# Patient Record
Sex: Male | Born: 1980 | Race: Black or African American | Hispanic: No | Marital: Single | Smoking: Current every day smoker
Health system: Southern US, Community
[De-identification: ages and names within clinical notes are randomized; demographics above are authoritative.]

## PROBLEM LIST (undated history)

## (undated) DIAGNOSIS — I82401 Acute embolism and thrombosis of unspecified deep veins of right lower extremity: Secondary | ICD-10-CM

## (undated) DIAGNOSIS — I2699 Other pulmonary embolism without acute cor pulmonale: Secondary | ICD-10-CM

## (undated) DIAGNOSIS — I10 Essential (primary) hypertension: Secondary | ICD-10-CM

## (undated) DIAGNOSIS — E119 Type 2 diabetes mellitus without complications: Secondary | ICD-10-CM

## (undated) DIAGNOSIS — E785 Hyperlipidemia, unspecified: Secondary | ICD-10-CM

## (undated) DIAGNOSIS — E079 Disorder of thyroid, unspecified: Secondary | ICD-10-CM

## (undated) HISTORY — DX: Type 2 diabetes mellitus without complications: E11.9

## (undated) HISTORY — DX: Acute embolism and thrombosis of unspecified deep veins of right lower extremity: I82.401

## (undated) HISTORY — PX: WISDOM TOOTH EXTRACTION: SHX21

## (undated) HISTORY — DX: Hyperlipidemia, unspecified: E78.5

---

## 2017-07-22 ENCOUNTER — Other Ambulatory Visit: Payer: Self-pay

## 2017-07-22 ENCOUNTER — Emergency Department (HOSPITAL_COMMUNITY)
Admission: EM | Admit: 2017-07-22 | Discharge: 2017-07-22 | Disposition: A | Discharge status: Home / Self Care | Payer: Self-pay | Attending: Emergency Medicine | Admitting: Emergency Medicine

## 2017-07-22 ENCOUNTER — Encounter (HOSPITAL_COMMUNITY): Payer: Self-pay | Admitting: Emergency Medicine

## 2017-07-22 DIAGNOSIS — Z79899 Other long term (current) drug therapy: Secondary | ICD-10-CM | POA: Insufficient documentation

## 2017-07-22 DIAGNOSIS — K0889 Other specified disorders of teeth and supporting structures: Secondary | ICD-10-CM | POA: Insufficient documentation

## 2017-07-22 DIAGNOSIS — I10 Essential (primary) hypertension: Secondary | ICD-10-CM | POA: Insufficient documentation

## 2017-07-22 HISTORY — DX: Essential (primary) hypertension: I10

## 2017-07-22 MED ORDER — IBUPROFEN 800 MG PO TABS
800.0000 mg | ORAL_TABLET | Freq: Once | ORAL | Status: AC
  Administered 2017-07-22: 800 mg via ORAL
  Filled 2017-07-22: qty 1

## 2017-07-22 MED ORDER — PENICILLIN V POTASSIUM 500 MG PO TABS: 500.0000 mg | ORAL_TABLET | Freq: Three times a day (TID) | ORAL | 0 refills | Status: DC

## 2017-07-22 MED ORDER — OXYCODONE-ACETAMINOPHEN 5-325 MG PO TABS
1.0000 | ORAL_TABLET | Freq: Once | ORAL | Status: AC
  Administered 2017-07-22: 1 via ORAL
  Filled 2017-07-22: qty 1

## 2017-07-22 MED ORDER — PENICILLIN V POTASSIUM 250 MG PO TABS
500.0000 mg | ORAL_TABLET | Freq: Once | ORAL | Status: AC
  Administered 2017-07-22: 500 mg via ORAL
  Filled 2017-07-22: qty 2

## 2017-07-22 MED ORDER — IBUPROFEN 600 MG PO TABS: 600.0000 mg | ORAL_TABLET | Freq: Four times a day (QID) | ORAL | 0 refills | Status: DC | PRN

## 2017-07-22 NOTE — ED Triage Notes (Signed)
Pt c/o right lower dental pain 10/10 since yesterday no getting relief with any OTC medication, no fever or chills.

## 2017-07-22 NOTE — ED Provider Notes (Signed)
[facility] Provider Note   CSN: [id number] Arrival date & time: 07/22/17  0133     History   Chief Complaint Chief Complaint  Patient presents with  . Dental Pain    HPI Trish FountainStacey Basque is a 36 y.o. male.  Patient to ED with complaint of severe dental pain in right lower molar. He denies facial swelling, difficulty swallowing, fever. No broken teeth or longstanding issues. Pain since yesterday. He has been taking ibuprofen without relief.    The history is provided by the patient. No language interpreter was used.    Past Medical History:  Diagnosis Date  . Hypertension     There are no active problems to display for this patient.   History reviewed. No pertinent surgical history.     Home Medications    Prior to Admission medications   Medication Sig Start Date End Date Taking? Authorizing Provider  acetaminophen (TYLENOL) 650 MG CR tablet Take 1,300 mg by mouth every 8 (eight) hours as needed for pain.   Yes [provider]  hydrochlorothiazide (HYDRODIURIL) 25 MG tablet Take 25 mg by mouth daily.   Yes [provider]  PRESCRIPTION MEDICATION Take 1 tablet by mouth daily. Little green tablet for blood pressure   Yes [provider]    Family History History reviewed. No pertinent family history.  Social History Social History   Tobacco Use  . Smoking status: Never Smoker  . Smokeless tobacco: Never Used  Substance Use Topics  . Alcohol use: No    Frequency: Never  . Drug use: No     Allergies   Patient has no known allergies.   Review of Systems Review of Systems  Constitutional: Negative for fever.  HENT: Positive for dental problem. Negative for facial swelling and trouble swallowing.   Gastrointestinal: Negative for nausea.     Physical Exam Updated Vital Signs BP (!) 168/88 (BP Location: Right Arm)   Pulse 65   Temp 98.3 F (36.8 C) (Oral)   Resp 14   Ht 6\' 2"   (1.88 m)   Wt 136.1 kg (300 lb)   SpO2 100%   BMI 38.52 kg/m   Physical Exam  Constitutional: He is oriented to person, place, and time. He appears well-developed and well-nourished.  HENT:  Partially edentulous with multiple missing molars. #29 does not appear to have caries. No visualized abscess. No facial swelling or submental lymphadenopathy.   Neck: Normal range of motion.  Pulmonary/Chest: Effort normal.  Musculoskeletal: Normal range of motion.  Neurological: He is alert and oriented to person, place, and time.  Skin: Skin is warm and dry.  Psychiatric: He has a normal mood and affect.     ED Treatments / Results  Labs (all labs ordered are listed, but only abnormal results are displayed) Labs Reviewed - No data to display  EKG  EKG Interpretation None       Radiology No results found.  Procedures Procedures (including critical care time)  Medications Ordered in ED Medications  oxyCODONE-acetaminophen (PERCOCET/ROXICET) 5-325 MG per tablet 1 tablet (not administered)  penicillin v potassium (VEETID) tablet 500 mg (not administered)  ibuprofen (ADVIL,MOTRIN) tablet 800 mg (not administered)     Initial Impression / Assessment and Plan / ED Course  I have reviewed the triage vital signs and the nursing notes.  Pertinent labs & imaging results that were available during my care of the patient were reviewed by me and considered in my medical decision  making (see chart for details).     Patient with dental pain. No visualized abscess or dental caries. Will cover with antibiotics and provide dental resources for outpatient follow up.   Final Clinical Impressions(s) / ED Diagnoses   Final diagnoses:  None   1. Dental pain  ED Discharge Orders    None       Elpidio AnisUpstill, Jonni Oelkers, PA-C 07/25/17 2023    Shon BatonHorton, Courtney F, MD 07/30/17 [id number]

## 2017-08-16 ENCOUNTER — Emergency Department (HOSPITAL_COMMUNITY)
Admission: EM | Admit: 2017-08-16 | Discharge: 2017-08-16 | Disposition: A | Discharge status: Home / Self Care | Payer: Self-pay | Attending: Emergency Medicine | Admitting: Emergency Medicine

## 2017-08-16 ENCOUNTER — Encounter (HOSPITAL_COMMUNITY): Payer: Self-pay | Admitting: Emergency Medicine

## 2017-08-16 DIAGNOSIS — Z79899 Other long term (current) drug therapy: Secondary | ICD-10-CM | POA: Insufficient documentation

## 2017-08-16 DIAGNOSIS — R55 Syncope and collapse: Secondary | ICD-10-CM | POA: Insufficient documentation

## 2017-08-16 DIAGNOSIS — I10 Essential (primary) hypertension: Secondary | ICD-10-CM | POA: Insufficient documentation

## 2017-08-16 LAB — CBC
HCT: 41.8 % (ref 39.0–52.0)
Hemoglobin: 14.1 g/dL (ref 13.0–17.0)
MCH: 30 pg (ref 26.0–34.0)
MCHC: 33.7 g/dL (ref 30.0–36.0)
MCV: 88.9 fL (ref 78.0–100.0)
Platelets: 207 10*3/uL (ref 150–400)
RBC: 4.7 MIL/uL (ref 4.22–5.81)
RDW: 12.8 % (ref 11.5–15.5)
WBC: 4.7 10*3/uL (ref 4.0–10.5)

## 2017-08-16 LAB — BASIC METABOLIC PANEL
Anion gap: 7 (ref 5–15)
BUN: 9 mg/dL (ref 6–20)
CHLORIDE: 101 mmol/L (ref 101–111)
CO2: 30 mmol/L (ref 22–32)
CREATININE: 1.04 mg/dL (ref 0.61–1.24)
Calcium: 9 mg/dL (ref 8.9–10.3)
GFR calc non Af Amer: >60 mL/min (ref >60)
GLUCOSE: 93 mg/dL (ref 65–99)
Potassium: 3.8 mmol/L (ref 3.5–5.1)
Sodium: 138 mmol/L (ref 135–145)

## 2017-08-16 LAB — CBG MONITORING, ED: Glucose-Capillary: 71 mg/dL (ref 65–99)

## 2017-08-16 MED ORDER — LISINOPRIL 20 MG PO TABS: 20.0000 mg | ORAL_TABLET | Freq: Every day | ORAL | 2 refills | Status: DC

## 2017-08-16 NOTE — ED Provider Notes (Signed)
[facility]-EMERGENCY DEPT Provider Note   CSN: [id number] Arrival date & time: 08/16/17  1232     History   Chief Complaint Chief Complaint  Patient presents with  . Dizziness  . Near Syncope    HPI Trish FountainStacey Chipley is a 37 y.o. male.  Patient states that he had some dizziness this morning.  It only lasted for less than an hour.  He has been taking blood pressure medicine but has not taken it in 2 months.   The history is provided by the patient.  Dizziness  Quality:  Lightheadedness and imbalance Severity:  Moderate Onset quality:  Sudden Timing:  Rare Progression:  Resolved Chronicity:  New Context: not with bowel movement   Associated symptoms: no chest pain, no diarrhea and no headaches   Near Syncope  Pertinent negatives include no chest pain, no abdominal pain and no headaches.    Past Medical History:  Diagnosis Date  . Hypertension     There are no active problems to display for this patient.   History reviewed. No pertinent surgical history.     Home Medications    Prior to Admission medications   Medication Sig Start Date End Date Taking? Authorizing Provider  acetaminophen (TYLENOL) 650 MG CR tablet Take 1,300 mg by mouth every 8 (eight) hours as needed for pain.    [provider]  hydrochlorothiazide (HYDRODIURIL) 25 MG tablet Take 25 mg by mouth daily.    [provider]  ibuprofen (ADVIL,MOTRIN) 600 MG tablet Take 1 tablet (600 mg total) by mouth every 6 (six) hours as needed. 07/22/17   Elpidio AnisUpstill, Shari, PA-C  lisinopril (PRINIVIL,ZESTRIL) 20 MG tablet Take 1 tablet (20 mg total) by mouth daily. 08/16/17   Bethann BerkshireZammit, Lluvia Gwynne, MD  penicillin v potassium (VEETID) 500 MG tablet Take 1 tablet (500 mg total) by mouth 3 (three) times daily. 07/22/17   Elpidio AnisUpstill, Shari, PA-C  PRESCRIPTION MEDICATION Take 1 tablet by mouth daily. Little green tablet for blood pressure    [provider]    Family History No family  history on file.  Social History Social History   Tobacco Use  . Smoking status: Never Smoker  . Smokeless tobacco: Never Used  Substance Use Topics  . Alcohol use: No    Frequency: Never  . Drug use: No     Allergies   Patient has no known allergies.   Review of Systems Review of Systems  Constitutional: Negative for appetite change and fatigue.  HENT: Negative for congestion, ear discharge and sinus pressure.   Eyes: Negative for discharge.  Respiratory: Negative for cough.   Cardiovascular: Positive for near-syncope. Negative for chest pain.  Gastrointestinal: Negative for abdominal pain and diarrhea.  Genitourinary: Negative for frequency and hematuria.  Musculoskeletal: Negative for back pain.  Skin: Negative for rash.  Neurological: Positive for dizziness. Negative for seizures and headaches.  Psychiatric/Behavioral: Negative for hallucinations.     Physical Exam Updated Vital Signs BP (!) 132/92 (BP Location: Right Arm)   Pulse (!) 56   Temp 98.2 F (36.8 C) (Oral)   Resp 14   Ht 6\' 2"  (1.88 m)   Wt 136.1 kg (300 lb)   SpO2 100%   BMI 38.52 kg/m   Physical Exam  Constitutional: He is oriented to person, place, and time. He appears well-developed.  HENT:  Head: Normocephalic.  Eyes: Conjunctivae and EOM are normal. No scleral icterus.  Neck: Neck supple. No thyromegaly present.  Cardiovascular: Normal rate and  regular rhythm. Exam reveals no gallop and no friction rub.  No murmur heard. Pulmonary/Chest: No stridor. He has no wheezes. He has no rales. He exhibits no tenderness.  Abdominal: He exhibits no distension. There is no tenderness. There is no rebound.  Musculoskeletal: Normal range of motion. He exhibits no edema.  Lymphadenopathy:    He has no cervical adenopathy.  Neurological: He is oriented to person, place, and time. He exhibits normal muscle tone. Coordination normal.  Skin: No rash noted. No erythema.  Psychiatric: He has a normal  mood and affect. His behavior is normal.     ED Treatments / Results  Labs (all labs ordered are listed, but only abnormal results are displayed) Labs Reviewed  BASIC METABOLIC PANEL  CBC  CBG MONITORING, ED    EKG  EKG Interpretation  Date/Time:  Thursday August 16 2017 12:52:12 EST Ventricular Rate:  70 PR Interval:    QRS Duration: 89 QT Interval:  385 QTC Calculation: 416 R Axis:   3 Text Interpretation:  Sinus rhythm Low voltage, precordial leads Anteroseptal infarct, old Nonspecific T abnormalities, lateral leads ST elevation, consider inferior injury Baseline wander in lead(s) I III aVL Confirmed by Bethann Berkshire [phone]) on 08/16/2017 6:44:29 PM       Radiology No results found.  Procedures Procedures (including critical care time)  Medications Ordered in ED Medications - No data to display   Initial Impression / Assessment and Plan / ED Course  I have reviewed the triage vital signs and the nursing notes.  Pertinent labs & imaging results that were available during my care of the patient were reviewed by me and considered in my medical decision making (see chart for details).     Patient with a near syncopal episode.  Possibly related to him not taking his blood pressure medicine.  Labs and EKG unremarkable.  Patient will be placed back on his lisinopril will follow-up with family doctor  Final Clinical Impressions(s) / ED Diagnoses   Final diagnoses:  Near syncope    ED Discharge Orders        Ordered    lisinopril (PRINIVIL,ZESTRIL) 20 MG tablet  Daily     08/16/17 1929       Bethann Berkshire, MD 08/16/17 1938

## 2017-08-16 NOTE — Discharge Instructions (Signed)
Start taking your blood pressure medicine and follow-up with her primary care doctor in the next couple weeks return if any problems

## 2017-08-16 NOTE — ED Triage Notes (Signed)
Per [facility] patient from home for dizziness that started about hour ago when woke up. Patient had near syncopal episode.  Patient doesn't feel right. Patient out of HTN for couple days.

## 2017-08-16 NOTE — ED Notes (Signed)
ED Provider at bedside. 

## 2017-11-17 ENCOUNTER — Encounter (HOSPITAL_COMMUNITY): Payer: Self-pay | Admitting: Emergency Medicine

## 2017-11-17 ENCOUNTER — Emergency Department (HOSPITAL_COMMUNITY)
Admission: EM | Admit: 2017-11-17 | Discharge: 2017-11-17 | Disposition: A | Discharge status: Home / Self Care | Payer: Self-pay | Attending: Emergency Medicine | Admitting: Emergency Medicine

## 2017-11-17 ENCOUNTER — Other Ambulatory Visit: Payer: Self-pay

## 2017-11-17 DIAGNOSIS — I10 Essential (primary) hypertension: Secondary | ICD-10-CM | POA: Insufficient documentation

## 2017-11-17 DIAGNOSIS — Z79899 Other long term (current) drug therapy: Secondary | ICD-10-CM | POA: Insufficient documentation

## 2017-11-17 DIAGNOSIS — Z760 Encounter for issue of repeat prescription: Secondary | ICD-10-CM | POA: Insufficient documentation

## 2017-11-17 DIAGNOSIS — F17210 Nicotine dependence, cigarettes, uncomplicated: Secondary | ICD-10-CM | POA: Insufficient documentation

## 2017-11-17 MED ORDER — LISINOPRIL 20 MG PO TABS: 20.0000 mg | ORAL_TABLET | Freq: Every day | ORAL | 3 refills | Status: DC

## 2017-11-17 NOTE — ED Notes (Signed)
Bed: WTR6 Expected date:  Expected time:  Means of arrival:  Comments: 

## 2017-11-17 NOTE — ED Provider Notes (Signed)
[facility]-EMERGENCY DEPT Provider Note   CSN: [id number] Arrival date & time: 11/17/17  0941     History   Chief Complaint Chief Complaint  Patient presents with  . Medication Refill    HPI Glenn Conrad is a 37 y.o. male.  HPI Patient is a 37 year old male with a history of hypertension.  He is requesting a refill of his 20 mg a day lisinopril.  He denies any symptoms at this time.  His initial primary care doctor visit is scheduled for May 2019.  He is requesting a refill until then.   Past Medical History:  Diagnosis Date  . Hypertension     There are no active problems to display for this patient.   Past Surgical History:  Procedure Laterality Date  . WISDOM TOOTH EXTRACTION          Home Medications    Prior to Admission medications   Medication Sig Start Date End Date Taking? Authorizing Provider  acetaminophen (TYLENOL) 650 MG CR tablet Take 1,300 mg by mouth every 8 (eight) hours as needed for pain.    [provider]  hydrochlorothiazide (HYDRODIURIL) 25 MG tablet Take 25 mg by mouth daily.    [provider]  ibuprofen (ADVIL,MOTRIN) 600 MG tablet Take 1 tablet (600 mg total) by mouth every 6 (six) hours as needed. 07/22/17   Elpidio Anis, PA-C  lisinopril (PRINIVIL,ZESTRIL) 20 MG tablet Take 1 tablet (20 mg total) by mouth daily. 11/17/17   Azalia Bilis, MD  penicillin v potassium (VEETID) 500 MG tablet Take 1 tablet (500 mg total) by mouth 3 (three) times daily. 07/22/17   Elpidio Anis, PA-C  PRESCRIPTION MEDICATION Take 1 tablet by mouth daily. Little green tablet for blood pressure    [provider]    Family History Family History  Problem Relation Age of Onset  . Diabetes Mother   . Hypertension Mother     Social History Social History   Tobacco Use  . Smoking status: Current Every Day Smoker    Packs/day: 0.50  . Smokeless tobacco: Never Used  Substance Use Topics  . Alcohol use: No      Frequency: Never  . Drug use: No     Allergies   Patient has no known allergies.   Review of Systems Review of Systems  All other systems reviewed and are negative.    Physical Exam Updated Vital Signs BP 116/64 (BP Location: Left Arm)   Pulse 66   Temp 98.3 F (36.8 C) (Oral)   Resp 18   Wt 131.5 kg (290 lb)   SpO2 99%   BMI 37.23 kg/m   Physical Exam  Constitutional: He is oriented to person, place, and time. He appears well-developed and well-nourished.  HENT:  Head: Normocephalic.  Eyes: EOM are normal.  Neck: Normal range of motion.  Pulmonary/Chest: Effort normal.  Abdominal: He exhibits no distension.  Musculoskeletal: Normal range of motion.  Neurological: He is alert and oriented to person, place, and time.  Psychiatric: He has a normal mood and affect.  Nursing note and vitals reviewed.    ED Treatments / Results  Labs (all labs ordered are listed, but only abnormal results are displayed) Labs Reviewed - No data to display  EKG None  Radiology No results found.  Procedures Procedures (including critical care time)  Medications Ordered in ED Medications - No data to display   Initial Impression / Assessment and Plan / ED Course  I have  reviewed the triage vital signs and the nursing notes.  Pertinent labs & imaging results that were available during my care of the patient were reviewed by me and considered in my medical decision making (see chart for details).      Final Clinical Impressions(s) / ED Diagnoses   Final diagnoses:  Medication refill    ED Discharge Orders        Ordered    lisinopril (PRINIVIL,ZESTRIL) 20 MG tablet  Daily     11/17/17 1012       Azalia Bilis, MD 11/17/17 1013

## 2017-11-17 NOTE — ED Triage Notes (Signed)
Pt is requesting a refill on his BP medication. Lisinopril was last filled in January

## 2018-02-04 ENCOUNTER — Emergency Department (HOSPITAL_COMMUNITY)
Admission: EM | Admit: 2018-02-04 | Discharge: 2018-02-04 | Disposition: A | Discharge status: Home / Self Care | Payer: Self-pay | Attending: Emergency Medicine | Admitting: Emergency Medicine

## 2018-02-04 ENCOUNTER — Encounter (HOSPITAL_COMMUNITY): Payer: Self-pay

## 2018-02-04 DIAGNOSIS — I10 Essential (primary) hypertension: Secondary | ICD-10-CM | POA: Insufficient documentation

## 2018-02-04 DIAGNOSIS — F17200 Nicotine dependence, unspecified, uncomplicated: Secondary | ICD-10-CM | POA: Insufficient documentation

## 2018-02-04 DIAGNOSIS — Z79899 Other long term (current) drug therapy: Secondary | ICD-10-CM | POA: Insufficient documentation

## 2018-02-04 MED ORDER — HYDROCHLOROTHIAZIDE 25 MG PO TABS: 25.0000 mg | ORAL_TABLET | Freq: Every day | ORAL | 0 refills | Status: DC

## 2018-02-04 NOTE — ED Notes (Signed)
Pt denies dizziness, SOB or HA at this time. Has had intermittent HA previously.

## 2018-02-04 NOTE — Discharge Instructions (Addendum)
You can start taking HCTZ, stop taking Lisinopril.   Schedule an appointment with [facility] (listed below) to establish care with a primary doctor.   Return to the ER for any new or concerning symptoms like chest pain, trouble breathing, trouble with your vision.

## 2018-02-04 NOTE — ED Provider Notes (Signed)
[facility]   CSN: [id number] Arrival date & time: 02/04/18  1559     History   Chief Complaint Chief Complaint  Patient presents with  . Hypertension    HPI Glenn Conrad is a 37 y.o. male.  HPI   Glenn Conrad is a 37yo male with a history of HTN who presents to the emergency department for evaluation of elevated blood pressure.  Patient reports that yesterday he had a headache which she thought was related to high blood pressure.  Today he went to [facility] to have his BP checked and it was 182/117.  States that he has a mild temporal headache today, thinks it is related to not eating for the past several hours.  States that he has been prescribed lisinopril from the ED, but it is not helping him.  He is looking for a PCP, but has not made an appointment.  He states that he was on a regimen of HCTZ and another antihypertensive while in prison and his blood pressure was under control.  He believes that the lisinopril is not working and is asking for a different medication.  Denies visual disturbance, numbness, weakness, chest pain, shortness of breath, abdominal pain, nausea/vomiting.  Past Medical History:  Diagnosis Date  . Hypertension     There are no active problems to display for this patient.   Past Surgical History:  Procedure Laterality Date  . WISDOM TOOTH EXTRACTION          Home Medications    Prior to Admission medications   Medication Sig Start Date End Date Taking? Authorizing Provider  acetaminophen (TYLENOL) 650 MG CR tablet Take 1,300 mg by mouth every 8 (eight) hours as needed for pain.    [provider]  hydrochlorothiazide (HYDRODIURIL) 25 MG tablet Take 25 mg by mouth daily.    [provider]  ibuprofen (ADVIL,MOTRIN) 600 MG tablet Take 1 tablet (600 mg total) by mouth every 6 (six) hours as needed. 07/22/17   Elpidio Anis, PA-C  lisinopril (PRINIVIL,ZESTRIL) 20 MG tablet Take  1 tablet (20 mg total) by mouth daily. 11/17/17   Azalia Bilis, MD  penicillin v potassium (VEETID) 500 MG tablet Take 1 tablet (500 mg total) by mouth 3 (three) times daily. 07/22/17   Elpidio Anis, PA-C  PRESCRIPTION MEDICATION Take 1 tablet by mouth daily. Little green tablet for blood pressure    [provider]    Family History Family History  Problem Relation Age of Onset  . Diabetes Mother   . Hypertension Mother     Social History Social History   Tobacco Use  . Smoking status: Current Every Day Smoker    Packs/day: 0.50  . Smokeless tobacco: Never Used  Substance Use Topics  . Alcohol use: No    Frequency: Never  . Drug use: No     Allergies   Patient has no known allergies.   Review of Systems Review of Systems  Constitutional: Negative for chills and fever.  Eyes: Negative for visual disturbance.  Respiratory: Negative for shortness of breath.   Cardiovascular: Negative for chest pain and leg swelling.  Gastrointestinal: Negative for abdominal pain, nausea and vomiting.  Genitourinary: Negative for difficulty urinating.  Musculoskeletal: Negative for gait problem.  Skin: Negative for rash.  Neurological: Positive for headaches. Negative for syncope, speech difficulty, weakness, light-headedness and numbness.  Psychiatric/Behavioral: Negative for agitation.    Physical Exam Updated Vital Signs BP 136/90   Pulse Marland Kitchen)  52   Temp 98 F (36.7 C) (Oral)   Resp 10   Ht 6\' 2"  (1.88 m)   Wt (!) 142.4 kg (314 lb)   SpO2 98%   BMI 40.32 kg/m   Physical Exam  Constitutional: He is oriented to person, place, and time. He appears well-developed and well-nourished. No distress.  Nontoxic-appearing.  HENT:  Head: Normocephalic and atraumatic.  Mouth/Throat: Oropharynx is clear and moist. No oropharyngeal exudate.  Eyes: Pupils are equal, round, and reactive to light. Conjunctivae are normal. Right eye exhibits no discharge. Left eye exhibits no  discharge.  Neck: Normal range of motion. Neck supple. No JVD present.  Cardiovascular: Normal rate, regular rhythm and intact distal pulses. Exam reveals no friction rub.  No murmur heard. Pulmonary/Chest: Effort normal and breath sounds normal. No stridor. No respiratory distress. He has no wheezes. He has no rales.  Abdominal: Soft. Bowel sounds are normal. There is no tenderness.  Neurological: He is alert and oriented to person, place, and time. Coordination normal.  Skin: Skin is warm and dry. Capillary refill takes less than 2 seconds. He is not diaphoretic.  Psychiatric: He has a normal mood and affect. His behavior is normal.  Nursing note and vitals reviewed.    ED Treatments / Results  Labs (all labs ordered are listed, but only abnormal results are displayed) Labs Reviewed - No data to display  EKG None  Radiology No results found.  Procedures Procedures (including critical care time)  Medications Ordered in ED Medications - No data to display   Initial Impression / Assessment and Plan / ED Course  I have reviewed the triage vital signs and the nursing notes.  Pertinent labs & imaging results that were available during my care of the patient were reviewed by me and considered in my medical decision making (see chart for details).    Patient's blood pressure in the ER is 135/84. He is asking to be prescribed a different antihypertensive, as he believes that the lisinopril is not working for him given his blood pressure was elevated earlier today.  I agreed to start HCTZ instead of lisinopril.  I reviewed his prior lab work and his electrolytes were within normal, creatinine normal.  I have counseled him extensively that he needs to establish care with a primary doctor and have given him the information to establish care with [facility] given he does not have insurance.  Have counseled him on reasons to return to the emergency department he agrees and voiced  understanding to the above plan and appears reliable for follow-up.  Final Clinical Impressions(s) / ED Diagnoses   Final diagnoses:  Hypertension, unspecified type    ED Discharge Orders        Ordered    hydrochlorothiazide (HYDRODIURIL) 25 MG tablet  Daily     02/04/18 2212       Kellie ShropshireShrosbree, Shuntavia Yerby J, PA-C 02/04/18 2317    Lorre NickAllen, Anthony, MD 02/05/18 1750

## 2018-02-04 NOTE — ED Triage Notes (Signed)
Pt presents with c/o hypertension. Pt reports that he did have some headaches yesterday as well as this morning and when he went to [facility] to have his BP checked it was 182/117. Pt does have a hx of high BP, taking Lisinopril on a daily basis, has had his dose this morning.

## 2018-05-27 ENCOUNTER — Other Ambulatory Visit: Payer: Self-pay

## 2018-05-27 ENCOUNTER — Encounter (HOSPITAL_COMMUNITY): Payer: Self-pay

## 2018-05-27 ENCOUNTER — Emergency Department (HOSPITAL_COMMUNITY)
Admission: EM | Admit: 2018-05-27 | Discharge: 2018-05-27 | Disposition: A | Discharge status: Home / Self Care | Payer: Self-pay | Attending: Emergency Medicine | Admitting: Emergency Medicine

## 2018-05-27 DIAGNOSIS — R51 Headache: Secondary | ICD-10-CM | POA: Insufficient documentation

## 2018-05-27 DIAGNOSIS — Z79899 Other long term (current) drug therapy: Secondary | ICD-10-CM | POA: Insufficient documentation

## 2018-05-27 DIAGNOSIS — R519 Headache, unspecified: Secondary | ICD-10-CM

## 2018-05-27 DIAGNOSIS — F17210 Nicotine dependence, cigarettes, uncomplicated: Secondary | ICD-10-CM | POA: Insufficient documentation

## 2018-05-27 DIAGNOSIS — I10 Essential (primary) hypertension: Secondary | ICD-10-CM | POA: Insufficient documentation

## 2018-05-27 DIAGNOSIS — Z760 Encounter for issue of repeat prescription: Secondary | ICD-10-CM | POA: Insufficient documentation

## 2018-05-27 HISTORY — DX: Disorder of thyroid, unspecified: E07.9

## 2018-05-27 MED ORDER — METOCLOPRAMIDE HCL 10 MG PO TABS
10.0000 mg | ORAL_TABLET | Freq: Once | ORAL | Status: AC
  Administered 2018-05-27: 10 mg via ORAL
  Filled 2018-05-27: qty 1

## 2018-05-27 MED ORDER — IBUPROFEN 200 MG PO TABS
600.0000 mg | ORAL_TABLET | Freq: Once | ORAL | Status: AC
  Administered 2018-05-27: 600 mg via ORAL
  Filled 2018-05-27: qty 3

## 2018-05-27 MED ORDER — HYDROCHLOROTHIAZIDE 25 MG PO TABS: 25.0000 mg | ORAL_TABLET | Freq: Every day | ORAL | 2 refills | Status: DC

## 2018-05-27 NOTE — ED Triage Notes (Signed)
Patient reports that he has been out of BP meds approx 2 months. Patient states the last med prescribed was Lisinopril.

## 2018-05-27 NOTE — ED Provider Notes (Signed)
[facility]-EMERGENCY DEPT Provider Note   CSN: [id number] Arrival date & time: 05/27/18  4132     History   Chief Complaint Chief Complaint  Patient presents with  . Medication Refill  . Headache    HPI Glenn Conrad is a 37 y.o. male.  HPI Patient states he has been out of his blood pressure medication for some time.  States he woke up this morning with a left-sided headache.  Denies any known injury.  States he frequently gets headaches associated with elevations in his blood pressure.  No nausea or vomiting but does endorse some photophobia.  Denies focal weakness or numbness.  No chest pain or shortness of breath. Past Medical History:  Diagnosis Date  . Hypertension   . Thyroid disease     There are no active problems to display for this patient.   Past Surgical History:  Procedure Laterality Date  . WISDOM TOOTH EXTRACTION          Home Medications    Prior to Admission medications   Medication Sig Start Date End Date Taking? Authorizing Provider  acetaminophen (TYLENOL) 650 MG CR tablet Take 1,300 mg by mouth every 8 (eight) hours as needed for pain.    [provider]  hydrochlorothiazide (HYDRODIURIL) 25 MG tablet Take 25 mg by mouth daily.    [provider]  hydrochlorothiazide (HYDRODIURIL) 25 MG tablet Take 1 tablet (25 mg total) by mouth daily. 02/04/18   Kellie Shropshire, PA-C  ibuprofen (ADVIL,MOTRIN) 600 MG tablet Take 1 tablet (600 mg total) by mouth every 6 (six) hours as needed. 07/22/17   Elpidio Anis, PA-C  lisinopril (PRINIVIL,ZESTRIL) 20 MG tablet Take 1 tablet (20 mg total) by mouth daily. 11/17/17   Azalia Bilis, MD  penicillin v potassium (VEETID) 500 MG tablet Take 1 tablet (500 mg total) by mouth 3 (three) times daily. 07/22/17   Elpidio Anis, PA-C  PRESCRIPTION MEDICATION Take 1 tablet by mouth daily. Little green tablet for blood pressure    [provider]    Family History Family  History  Problem Relation Age of Onset  . Diabetes Mother   . Hypertension Mother     Social History Social History   Tobacco Use  . Smoking status: Current Every Day Smoker    Packs/day: 0.50  . Smokeless tobacco: Never Used  Substance Use Topics  . Alcohol use: No    Frequency: Never  . Drug use: No     Allergies   Patient has no known allergies.   Review of Systems Review of Systems  Constitutional: Negative for chills and fever.  HENT: Negative for facial swelling, sinus pressure, sore throat and trouble swallowing.   Eyes: Positive for photophobia. Negative for visual disturbance.  Respiratory: Negative for cough and shortness of breath.   Cardiovascular: Negative for chest pain, palpitations and leg swelling.  Gastrointestinal: Negative for abdominal pain, diarrhea, nausea and vomiting.  Musculoskeletal: Negative for back pain, myalgias and neck pain.  Skin: Negative for rash and wound.  Neurological: Positive for headaches. Negative for dizziness, syncope, weakness, light-headedness and numbness.  All other systems reviewed and are negative.    Physical Exam Updated Vital Signs BP (!) 139/93 (BP Location: Left Arm)   Pulse (!) 55   Temp 98.2 F (36.8 C) (Oral)   Resp 18   Ht 6\' 2"  (1.88 m)   Wt (!) 144.9 kg   SpO2 100%   BMI 41.02 kg/m   Physical Exam  Constitutional: He is oriented to person, place, and time. He appears well-developed and well-nourished. No distress.  HENT:  Head: Normocephalic and atraumatic.  Mouth/Throat: Oropharynx is clear and moist. No oropharyngeal exudate.  No temporal artery tenderness to palpation.  No sinus tenderness with percussion.  Eyes: Pupils are equal, round, and reactive to light. EOM are normal.  Neck: Normal range of motion. Neck supple.  Cardiovascular: Normal rate and regular rhythm. Exam reveals no gallop and no friction rub.  No murmur heard. Pulmonary/Chest: Effort normal and breath sounds normal. No  stridor. No respiratory distress. He has no wheezes. He has no rales. He exhibits no tenderness.  Abdominal: Soft. Bowel sounds are normal. There is no tenderness. There is no rebound and no guarding.  Musculoskeletal: Normal range of motion. He exhibits no edema or tenderness.  Neurological: He is alert and oriented to person, place, and time.  Moves all extremities without focal deficit.  Sensation fully intact.  Skin: Skin is warm and dry. Capillary refill takes less than 2 seconds. No rash noted. He is not diaphoretic. No erythema.  Psychiatric: He has a normal mood and affect. His behavior is normal.  Nursing note and vitals reviewed.    ED Treatments / Results  Labs (all labs ordered are listed, but only abnormal results are displayed) Labs Reviewed - No data to display  EKG None  Radiology No results found.  Procedures Procedures (including critical care time)  Medications Ordered in ED Medications  ibuprofen (ADVIL,MOTRIN) tablet 600 mg (has no administration in time range)  metoCLOPramide (REGLAN) tablet 10 mg (has no administration in time range)     Initial Impression / Assessment and Plan / ED Course  I have reviewed the triage vital signs and the nursing notes.  Pertinent labs & imaging results that were available during my care of the patient were reviewed by me and considered in my medical decision making (see chart for details).    Patient's headache improved significantly with Reglan and ibuprofen.  Will refill his blood pressure medication.  Has been encouraged to to establish care with primary physician regarding blood pressure control.  Return precautions given.   Final Clinical Impressions(s) / ED Diagnoses   Final diagnoses:  None    ED Discharge Orders    None       Loren Racer, MD 05/29/18 [phone]

## 2018-05-27 NOTE — ED Notes (Signed)
ED Provider at bedside. 

## 2018-06-16 ENCOUNTER — Emergency Department (HOSPITAL_COMMUNITY)
Admission: EM | Admit: 2018-06-16 | Discharge: 2018-06-16 | Disposition: A | Discharge status: Home / Self Care | Payer: Self-pay | Attending: Emergency Medicine | Admitting: Emergency Medicine

## 2018-06-16 ENCOUNTER — Other Ambulatory Visit: Payer: Self-pay

## 2018-06-16 ENCOUNTER — Encounter (HOSPITAL_COMMUNITY): Payer: Self-pay

## 2018-06-16 DIAGNOSIS — I10 Essential (primary) hypertension: Secondary | ICD-10-CM | POA: Insufficient documentation

## 2018-06-16 DIAGNOSIS — L259 Unspecified contact dermatitis, unspecified cause: Secondary | ICD-10-CM | POA: Insufficient documentation

## 2018-06-16 DIAGNOSIS — L299 Pruritus, unspecified: Secondary | ICD-10-CM | POA: Insufficient documentation

## 2018-06-16 DIAGNOSIS — F17210 Nicotine dependence, cigarettes, uncomplicated: Secondary | ICD-10-CM | POA: Insufficient documentation

## 2018-06-16 DIAGNOSIS — E079 Disorder of thyroid, unspecified: Secondary | ICD-10-CM | POA: Insufficient documentation

## 2018-06-16 MED ORDER — DOXYCYCLINE HYCLATE 100 MG PO CAPS: 100.0000 mg | ORAL_CAPSULE | Freq: Two times a day (BID) | ORAL | 0 refills | Status: DC

## 2018-06-16 MED ORDER — HYDROXYZINE HCL 25 MG PO TABS: 25.0000 mg | ORAL_TABLET | Freq: Four times a day (QID) | ORAL | 0 refills | Status: DC | PRN

## 2018-06-16 MED ORDER — METHYLPREDNISOLONE ACETATE 80 MG/ML IJ SUSP
80.0000 mg | Freq: Once | INTRAMUSCULAR | Status: AC
  Administered 2018-06-16: 80 mg via INTRAMUSCULAR
  Filled 2018-06-16: qty 1

## 2018-06-16 NOTE — ED Provider Notes (Signed)
[facility]-EMERGENCY DEPT Provider Note   CSN: [id number] Arrival date & time: 06/16/18  0420     History   Chief Complaint Chief Complaint  Patient presents with  . Rash    HPI Glenn Conrad is a 37 y.o. male.  Patient presents to the emergency department for evaluation of rash.  Patient reports that 3 days ago he started with a small spot on the back of his elbow that he thought was a bite.  The area that blistered up and since then he has had progression of swelling, itching, burning pain with blisters over the forearm area.  Denies any new skin products or topical contacts.  No tongue swelling, throat swelling, difficulty breathing.     Past Medical History:  Diagnosis Date  . Hypertension   . Thyroid disease     There are no active problems to display for this patient.   Past Surgical History:  Procedure Laterality Date  . WISDOM TOOTH EXTRACTION          Home Medications    Prior to Admission medications   Medication Sig Start Date End Date Taking? Authorizing Provider  aspirin-acetaminophen-caffeine (EXCEDRIN MIGRAINE) [phone] MG tablet Take 2 tablets by mouth every 8 (eight) hours as needed for headache.    [provider]  doxycycline (VIBRAMYCIN) 100 MG capsule Take 1 capsule (100 mg total) by mouth 2 (two) times daily. 06/16/18   Gilda Crease, MD  hydrochlorothiazide (HYDRODIURIL) 25 MG tablet Take 1 tablet (25 mg total) by mouth daily. 05/27/18   Loren Racer, MD  hydrOXYzine (ATARAX/VISTARIL) 25 MG tablet Take 1-2 tablets (25-50 mg total) by mouth every 6 (six) hours as needed. 06/16/18   Gilda Crease, MD  ibuprofen (ADVIL,MOTRIN) 600 MG tablet Take 1 tablet (600 mg total) by mouth every 6 (six) hours as needed. Patient not taking: Reported on 05/27/2018 07/22/17   Elpidio Anis, PA-C  lisinopril (PRINIVIL,ZESTRIL) 20 MG tablet Take 1 tablet (20 mg total) by mouth daily. Patient not taking:  Reported on 05/27/2018 11/17/17   Azalia Bilis, MD  penicillin v potassium (VEETID) 500 MG tablet Take 1 tablet (500 mg total) by mouth 3 (three) times daily. Patient not taking: Reported on 05/27/2018 07/22/17   Elpidio Anis, PA-C    Family History Family History  Problem Relation Age of Onset  . Diabetes Mother   . Hypertension Mother     Social History Social History   Tobacco Use  . Smoking status: Current Every Day Smoker    Packs/day: 0.50  . Smokeless tobacco: Never Used  Substance Use Topics  . Alcohol use: No    Frequency: Never  . Drug use: No     Allergies   Patient has no known allergies.   Review of Systems Review of Systems  Skin: Positive for rash.  All other systems reviewed and are negative.    Physical Exam Updated Vital Signs BP (!) 129/92   Pulse 72   Temp 97.8 F (36.6 C) (Oral)   Resp 17   SpO2 95%   Physical Exam  Constitutional: He is oriented to person, place, and time. He appears well-developed and well-nourished.  HENT:  Head: Normocephalic.  Eyes: Pupils are equal, round, and reactive to light.  Neck: Neck supple.  Cardiovascular: Normal rate and regular rhythm.  Pulmonary/Chest: Effort normal and breath sounds normal.  Musculoskeletal: Normal range of motion.  Neurological: He is alert and oriented to person, place, and time.  Skin:  Diffuse vesicular eruption over right forearm -nearly circumferential.  Serosanguineous drainage and yellow crusting noted over dorsal aspect of forearm     ED Treatments / Results  Labs (all labs ordered are listed, but only abnormal results are displayed) Labs Reviewed - No data to display  EKG None  Radiology No results found.  Procedures Procedures (including critical care time)  Medications Ordered in ED Medications  methylPREDNISolone acetate (DEPO-MEDROL) injection 80 mg (has no administration in time range)     Initial Impression / Assessment and Plan / ED Course  I  have reviewed the triage vital signs and the nursing notes.  Pertinent labs & imaging results that were available during my care of the patient were reviewed by me and considered in my medical decision making (see chart for details).     Patient presents with 3-day history of rash on his right arm.  He thinks he might have suffered a bite that precipitated the rash.  Morphology looks like contact dermatitis, although cannot rule out impetigo or superinfection.  Morphology and distribution not consistent with shingles.  Will treat with Depo-Medrol and doxycycline.  Final Clinical Impressions(s) / ED Diagnoses   Final diagnoses:  Contact dermatitis, unspecified contact dermatitis type, unspecified trigger    ED Discharge Orders         Ordered    doxycycline (VIBRAMYCIN) 100 MG capsule  2 times daily     06/16/18 0511    hydrOXYzine (ATARAX/VISTARIL) 25 MG tablet  Every 6 hours PRN     06/16/18 0511           Gilda Crease, MD 06/16/18 [phone]

## 2018-06-16 NOTE — ED Triage Notes (Signed)
Pt reports raised rash on L forearm to upper arm. He states that he thinks he may have been bitten by something on Wednesday. Pt endorses burning, redness, itching, and heat from area.

## 2018-08-24 ENCOUNTER — Encounter (HOSPITAL_COMMUNITY): Payer: Self-pay | Admitting: Emergency Medicine

## 2018-08-24 ENCOUNTER — Emergency Department (HOSPITAL_COMMUNITY)
Admission: EM | Admit: 2018-08-24 | Discharge: 2018-08-24 | Disposition: A | Discharge status: Home / Self Care | Payer: Self-pay | Attending: Emergency Medicine | Admitting: Emergency Medicine

## 2018-08-24 DIAGNOSIS — F17200 Nicotine dependence, unspecified, uncomplicated: Secondary | ICD-10-CM | POA: Insufficient documentation

## 2018-08-24 DIAGNOSIS — E079 Disorder of thyroid, unspecified: Secondary | ICD-10-CM | POA: Insufficient documentation

## 2018-08-24 DIAGNOSIS — Z7982 Long term (current) use of aspirin: Secondary | ICD-10-CM | POA: Insufficient documentation

## 2018-08-24 DIAGNOSIS — I10 Essential (primary) hypertension: Secondary | ICD-10-CM | POA: Insufficient documentation

## 2018-08-24 DIAGNOSIS — Z760 Encounter for issue of repeat prescription: Secondary | ICD-10-CM | POA: Insufficient documentation

## 2018-08-24 MED ORDER — HYDROCHLOROTHIAZIDE 25 MG PO TABS: 25.0000 mg | ORAL_TABLET | Freq: Every day | ORAL | 1 refills | Status: DC

## 2018-08-24 NOTE — ED Provider Notes (Signed)
[facility]-EMERGENCY DEPT Provider Note   CSN: [id number] Arrival date & time: 08/24/18  6440     History   Chief Complaint Chief Complaint  Patient presents with  . Medication Refill    HPI Glenn Conrad is a 38 y.o. male with history of hypertension and thyroid disease presenting requesting refill of HCTZ.  Has been out for 2 days.  Reports that he does not check his blood pressure at home but will check it when he goes to a pharmacy and it has been generally well controlled while on 25 mg HCTZ.  Denies headache, chest pain, shortness of breath, weakness, or decreased urine output.  He is in the process of establishing care with a PCP.  The history is provided by the patient.    Past Medical History:  Diagnosis Date  . Hypertension   . Thyroid disease     There are no active problems to display for this patient.   Past Surgical History:  Procedure Laterality Date  . WISDOM TOOTH EXTRACTION          Home Medications    Prior to Admission medications   Medication Sig Start Date End Date Taking? Authorizing Provider  aspirin-acetaminophen-caffeine (EXCEDRIN MIGRAINE) [phone] MG tablet Take 2 tablets by mouth every 8 (eight) hours as needed for headache.    [provider]  doxycycline (VIBRAMYCIN) 100 MG capsule Take 1 capsule (100 mg total) by mouth 2 (two) times daily. 06/16/18   Gilda Crease, MD  hydrochlorothiazide (HYDRODIURIL) 25 MG tablet Take 1 tablet (25 mg total) by mouth daily. 08/24/18   Traniya Prichett A, PA-C  hydrOXYzine (ATARAX/VISTARIL) 25 MG tablet Take 1-2 tablets (25-50 mg total) by mouth every 6 (six) hours as needed. 06/16/18   Gilda Crease, MD  ibuprofen (ADVIL,MOTRIN) 600 MG tablet Take 1 tablet (600 mg total) by mouth every 6 (six) hours as needed. Patient not taking: Reported on 05/27/2018 07/22/17   Elpidio Anis, PA-C  lisinopril (PRINIVIL,ZESTRIL) 20 MG tablet Take 1 tablet (20 mg total) by  mouth daily. Patient not taking: Reported on 05/27/2018 11/17/17   Azalia Bilis, MD  penicillin v potassium (VEETID) 500 MG tablet Take 1 tablet (500 mg total) by mouth 3 (three) times daily. Patient not taking: Reported on 05/27/2018 07/22/17   Elpidio Anis, PA-C    Family History Family History  Problem Relation Age of Onset  . Diabetes Mother   . Hypertension Mother     Social History Social History   Tobacco Use  . Smoking status: Current Every Day Smoker    Packs/day: 0.50  . Smokeless tobacco: Never Used  Substance Use Topics  . Alcohol use: No    Frequency: Never  . Drug use: No     Allergies   Patient has no known allergies.   Review of Systems Review of Systems  Respiratory: Negative for shortness of breath.   Cardiovascular: Negative for chest pain.  Genitourinary: Negative for decreased urine volume.  Neurological: Negative for weakness, numbness and headaches.     Physical Exam Updated Vital Signs BP (!) 156/89 (BP Location: Left Arm)   Pulse 63   Temp 98.1 F (36.7 C) (Oral)   Resp 16   SpO2 100%   Physical Exam Vitals signs and nursing note reviewed.  Constitutional:      General: He is not in acute distress.    Appearance: He is well-developed.  HENT:     Head: Normocephalic and atraumatic.  Eyes:  General:        Right eye: No discharge.        Left eye: No discharge.     Conjunctiva/sclera: Conjunctivae normal.  Neck:     Vascular: No JVD.     Trachea: No tracheal deviation.  Cardiovascular:     Rate and Rhythm: Normal rate.  Pulmonary:     Effort: Pulmonary effort is normal.  Abdominal:     General: There is no distension.  Skin:    General: Skin is warm and dry.     Findings: No erythema.  Neurological:     Mental Status: He is alert.  Psychiatric:        Behavior: Behavior normal.      ED Treatments / Results  Labs (all labs ordered are listed, but only abnormal results are displayed) Labs Reviewed - No data to  display  EKG None  Radiology No results found.  Procedures Procedures (including critical care time)  Medications Ordered in ED Medications - No data to display   Initial Impression / Assessment and Plan / ED Course  I have reviewed the triage vital signs and the nursing notes.  Pertinent labs & imaging results that were available during my care of the patient were reviewed by me and considered in my medical decision making (see chart for details).    Patient presenting requesting refill of HCTZ.  He is afebrile, somewhat hypertensive.  He is overall well-appearing.  No concern for hypertensive urgency or emergency.  He has no acute complaints.  Discussed lifestyle modification.  Will refer to [facility] health and wellness for PCP follow-up.  Discussed strict ED return precautions. Pt verbalized understanding of and agreement with plan and is safe for discharge home at this time.   Final Clinical Impressions(s) / ED Diagnoses   Final diagnoses:  Encounter for medication refill  Hypertension, unspecified type    ED Discharge Orders         Ordered    hydrochlorothiazide (HYDRODIURIL) 25 MG tablet  Daily,   Status:  Discontinued     08/24/18 1028    hydrochlorothiazide (HYDRODIURIL) 25 MG tablet  Daily     08/24/18 [address] [place] A, PA-C 08/24/18 1030    Mancel Bale, MD 08/24/18 1658

## 2018-08-24 NOTE — Discharge Instructions (Addendum)
Continue taking your medications as prescribed.  Avoid foods that are high in sodium.  Continue staying active.  Call [facility] and wellness on Monday tell them you referred from the emergency department.  I have also attached other resources for follow-up with a primary care physician in the area.  Return to the emergency department if any concerning signs or symptoms develop such as chest pains, severe headaches, shortness of breath, or decreased urine output.

## 2018-08-24 NOTE — ED Triage Notes (Signed)
Pt needing medication refill of HCTZ 25mg  until he can get into PCP. Pt been out for 2 days.

## 2018-10-18 ENCOUNTER — Encounter (HOSPITAL_COMMUNITY): Payer: Self-pay | Admitting: *Deleted

## 2018-10-18 ENCOUNTER — Emergency Department (HOSPITAL_COMMUNITY)
Admission: EM | Admit: 2018-10-18 | Discharge: 2018-10-18 | Disposition: A | Discharge status: Home / Self Care | Payer: Self-pay | Attending: Emergency Medicine | Admitting: Emergency Medicine

## 2018-10-18 ENCOUNTER — Other Ambulatory Visit: Payer: Self-pay

## 2018-10-18 DIAGNOSIS — I10 Essential (primary) hypertension: Secondary | ICD-10-CM | POA: Insufficient documentation

## 2018-10-18 DIAGNOSIS — Z760 Encounter for issue of repeat prescription: Secondary | ICD-10-CM | POA: Insufficient documentation

## 2018-10-18 DIAGNOSIS — F17200 Nicotine dependence, unspecified, uncomplicated: Secondary | ICD-10-CM | POA: Insufficient documentation

## 2018-10-18 MED ORDER — HYDROCHLOROTHIAZIDE 25 MG PO TABS: 25.0000 mg | ORAL_TABLET | Freq: Every day | ORAL | 1 refills | Status: DC

## 2018-10-18 NOTE — ED Notes (Signed)
Bed: [facility]1 Expected date:  Expected time:  Means of arrival:  Comments: 

## 2018-10-18 NOTE — ED Provider Notes (Signed)
[facility]-EMERGENCY DEPT Provider Note   CSN: [id number] Arrival date & time: 10/18/18  1853    History   Chief Complaint Chief Complaint  Patient presents with  . Medication Refill    HPI Kensuke Panzarella is a 38 y.o. male here for medication refill. Patient reports he is not having any problems but was referred to [facility] and does not have an appointment until April and does not want to have any problems before then.      HPI  Past Medical History:  Diagnosis Date  . Hypertension   . Thyroid disease     There are no active problems to display for this patient.   Past Surgical History:  Procedure Laterality Date  . WISDOM TOOTH EXTRACTION          Home Medications    Prior to Admission medications   Medication Sig Start Date End Date Taking? Authorizing Provider  hydrochlorothiazide (HYDRODIURIL) 25 MG tablet Take 1 tablet (25 mg total) by mouth daily. 10/18/18   Janne Napoleon, NP  hydrOXYzine (ATARAX/VISTARIL) 25 MG tablet Take 1-2 tablets (25-50 mg total) by mouth every 6 (six) hours as needed. 06/16/18   Gilda Crease, MD    Family History Family History  Problem Relation Age of Onset  . Diabetes Mother   . Hypertension Mother     Social History Social History   Tobacco Use  . Smoking status: Current Every Day Smoker    Packs/day: 0.50  . Smokeless tobacco: Never Used  Substance Use Topics  . Alcohol use: No    Frequency: Never  . Drug use: No     Allergies   Patient has no known allergies.   Review of Systems Review of Systems  All other systems reviewed and are negative. patient denies any symptoms at this time.    Physical Exam Updated Vital Signs BP (!) 142/94 (BP Location: Left Arm)   Pulse 74   Temp 98.4 F (36.9 C) (Oral)   Resp 18   SpO2 95%   Physical Exam Vitals signs and nursing note reviewed.  Constitutional:      Appearance: He is well-developed.  HENT:     Head:  Normocephalic.     Mouth/Throat:     Mouth: Mucous membranes are moist.  Eyes:     Conjunctiva/sclera: Conjunctivae normal.  Neck:     Musculoskeletal: Neck supple.  Cardiovascular:     Rate and Rhythm: Normal rate.  Pulmonary:     Effort: Pulmonary effort is normal.  Musculoskeletal: Normal range of motion.  Skin:    General: Skin is warm and dry.  Neurological:     Mental Status: He is alert and oriented to person, place, and time.  Psychiatric:        Mood and Affect: Mood normal.      ED Treatments / Results  Labs (all labs ordered are listed, but only abnormal results are displayed) Labs Reviewed - No data to display  Radiology No results found.  Procedures Procedures (including critical care time)  Medications Ordered in ED Medications - No data to display   Initial Impression / Assessment and Plan / ED Course  I have reviewed the triage vital signs and the nursing notes. 38 y.o. male here for medication refill until his appointment with [facility] and Wellness without any other complaints. Will refill HCTZ.  Final Clinical Impressions(s) / ED Diagnoses   Final diagnoses:  Medication refill  ED Discharge Orders         Ordered    hydrochlorothiazide (HYDRODIURIL) 25 MG tablet  Daily,   Status:  Discontinued     10/18/18 1921    hydrochlorothiazide (HYDRODIURIL) 25 MG tablet  Daily     10/18/18 1925           Kerrie Buffalo [place], [place] 10/18/18 1939    Derwood Kaplan, MD 10/19/18 [phone]

## 2018-10-18 NOTE — Discharge Instructions (Addendum)
Keep your appointment with [facility] and Wellness. Return here as needed.

## 2018-10-18 NOTE — ED Triage Notes (Signed)
Pt states he needs prescription for his blood pressure medication. Pt states he will not be able to see a PCP until April. Pt states he ran out of HCTZ.

## 2018-11-27 ENCOUNTER — Ambulatory Visit: Payer: Self-pay | Attending: Nurse Practitioner | Admitting: Nurse Practitioner

## 2018-11-27 ENCOUNTER — Encounter: Payer: Self-pay | Admitting: Nurse Practitioner

## 2018-11-27 ENCOUNTER — Other Ambulatory Visit: Payer: Self-pay

## 2018-11-27 DIAGNOSIS — F17210 Nicotine dependence, cigarettes, uncomplicated: Secondary | ICD-10-CM

## 2018-11-27 DIAGNOSIS — Z6841 Body Mass Index (BMI) 40.0 and over, adult: Secondary | ICD-10-CM

## 2018-11-27 DIAGNOSIS — I10 Essential (primary) hypertension: Secondary | ICD-10-CM

## 2018-11-27 DIAGNOSIS — F17200 Nicotine dependence, unspecified, uncomplicated: Secondary | ICD-10-CM

## 2018-11-27 MED ORDER — HYDROCHLOROTHIAZIDE 25 MG PO TABS: 25.0000 mg | ORAL_TABLET | Freq: Every day | ORAL | 0 refills | Status: DC

## 2018-11-27 NOTE — Progress Notes (Signed)
Glenn Conrad was seen today for new patient (initial visit).  Diagnoses and all orders for this visit:  Essential hypertension -     hydrochlorothiazide (HYDRODIURIL) 25 MG tablet; Take 1 tablet (25 mg total) by mouth daily. Continue all antihypertensives as prescribed.  Remember to bring in your blood pressure log with you for your follow up appointment.  DASH/Mediterranean Diets are healthier choices for HTN.    Morbid obesity with BMI of 40.0-44.9, adult (HCC) Discussed diet and exercise for person with BMI >40. Instructed: You must burn more calories than you eat. Losing 5 percent of your body weight should be considered a success. In the longer term, losing more than 15 percent of your body weight and staying at this weight is an extremely good result. However, keep in mind that even losing 5 percent of your body weight leads to important health benefits, so try not to get discouraged if you're not able to lose more than this. Will recheck weight in 3-6 months.  Tobacco dependence Glenn Conrad was counseled on the dangers of tobacco use, and was advised to quit. Reviewed strategies to maximize success, including removing cigarettes and smoking materials from environment, stress management and support of family/friends as well as pharmacological alternatives including: Wellbutrin, Chantix, Nicotine patch, Nicotine gum or lozenges. Smoking cessation support: smoking cessation hotline: 1-800-QUIT-NOW.  Smoking cessation classes are also available through [facility]. Call 5318133164 or visit our website at [email or web address].   A total of 3 minutes was spent on counseling for smoking cessation and Glenn Conrad is not ready to quit.    Patient has been counseled on age-appropriate routine health concerns for screening and prevention. These are reviewed and up-to-date. Referrals have been placed accordingly. Immunizations are up-to-date or declined.    Subjective:   Chief  Complaint  Patient presents with  . New Patient (Initial Visit)    Pt. wanted a primary provider for Hypertension and check up.    HPI Glenn Conrad 38 y.o. male presents for telehealth visit.   Essential Hypertension Chronic. Not well controlled in the past due to medical noncompliance. He is also a smoker and morbidly obese with BMI 41. States he has not been working out but has been active in the past. Also not diet compliant.  Does not monitor his blood pressure at home. Denies chest pain, shortness of breath, palpitations, lightheadedness, dizziness, headaches or BLE edema.  BP Readings from Last 3 Encounters:  10/18/18 (!) 142/94  08/24/18 (!) 156/89  06/16/18 (!) 129/92     Review of Systems  Constitutional: Negative for fever, malaise/fatigue and weight loss.  HENT: Negative.  Negative for nosebleeds.   Eyes: Negative.  Negative for blurred vision, double vision and photophobia.  Respiratory: Negative.  Negative for cough and shortness of breath.   Cardiovascular: Negative.  Negative for chest pain, palpitations and leg swelling.  Gastrointestinal: Negative.  Negative for heartburn, nausea and vomiting.  Musculoskeletal: Negative.  Negative for myalgias.  Neurological: Negative.  Negative for dizziness, focal weakness, seizures and headaches.  Psychiatric/Behavioral: Negative.  Negative for suicidal ideas.    Past Medical History:  Diagnosis Date  . Hypertension   . Thyroid disease     Past Surgical History:  Procedure Laterality Date  . WISDOM TOOTH EXTRACTION      Family History  Problem Relation Age of Onset  . Diabetes Mother   . Hypertension Mother   . Hypertension Father   . Diabetes Father     Social  History Reviewed with no changes to be made today.   Outpatient Medications Prior to Visit  Medication Sig Dispense Refill  . hydrochlorothiazide (HYDRODIURIL) 25 MG tablet Take 1 tablet (25 mg total) by mouth daily. 30 tablet 1  . hydrOXYzine  (ATARAX/VISTARIL) 25 MG tablet Take 1-2 tablets (25-50 mg total) by mouth every 6 (six) hours as needed. (Patient not taking: Reported on 11/27/2018) 30 tablet 0   No facility-administered medications prior to visit.     No Known Allergies     Objective:    There were no vitals taken for this visit. Wt Readings from Last 3 Encounters:  05/27/18 (!) 319 lb 8 oz (144.9 kg)  02/04/18 (!) 314 lb (142.4 kg)  11/17/17 290 lb (131.5 kg)        Patient has been counseled extensively about nutrition and exercise as well as the importance of adherence with medications and regular follow-up. The patient was given clear instructions to go to ER or return to medical center if symptoms don't improve, worsen or new problems develop. The patient verbalized understanding.   Follow-up: Return in about 2 months (around 01/27/2019).   Total telehealth phone time spent with patient was 15 min. Greater than 50 % of this visit was spent counseling and coordinating care regarding risk factor modification, compliance importance and encouragement, education related to the chronic medical  conditions.  Claiborne RiggZelda W , FNP-BC [facility] [place], [place] [phone]   11/27/2018, 2:12 PM

## 2018-12-06 ENCOUNTER — Other Ambulatory Visit: Payer: Self-pay

## 2018-12-06 ENCOUNTER — Other Ambulatory Visit: Payer: Self-pay | Admitting: Nurse Practitioner

## 2018-12-06 DIAGNOSIS — I10 Essential (primary) hypertension: Secondary | ICD-10-CM

## 2018-12-07 LAB — CMP14+EGFR
ALT: 60 IU/L — ABNORMAL HIGH (ref 0–44)
AST: 27 IU/L (ref 0–40)
Albumin/Globulin Ratio: 1.4 (ref 1.2–2.2)
Albumin: 4.6 g/dL (ref 4.0–5.0)
Alkaline Phosphatase: 64 IU/L (ref 39–117)
BUN/Creatinine Ratio: 12 (ref 9–20)
BUN: 11 mg/dL (ref 6–20)
Bilirubin Total: 0.5 mg/dL (ref 0.0–1.2)
CO2: 27 mmol/L (ref 20–29)
Calcium: 9.9 mg/dL (ref 8.7–10.2)
Chloride: 99 mmol/L (ref 96–106)
Creatinine, Ser: 0.9 mg/dL (ref 0.76–1.27)
GFR calc Af Amer: 124 mL/min/{1.73_m2} (ref >59)
GFR calc non Af Amer: 107 mL/min/{1.73_m2} (ref >59)
Globulin, Total: 3.4 g/dL (ref 1.5–4.5)
Glucose: 97 mg/dL (ref 65–99)
Potassium: 3.9 mmol/L (ref 3.5–5.2)
Sodium: 143 mmol/L (ref 134–144)
Total Protein: 8 g/dL (ref 6.0–8.5)

## 2018-12-07 LAB — CBC
Hematocrit: 41.8 % (ref 37.5–51.0)
Hemoglobin: 14 g/dL (ref 13.0–17.7)
MCH: 29.1 pg (ref 26.6–33.0)
MCHC: 33.5 g/dL (ref 31.5–35.7)
MCV: 87 fL (ref 79–97)
Platelets: 226 10*3/uL (ref 150–450)
RBC: 4.81 x10E6/uL (ref 4.14–5.80)
RDW: 13 % (ref 11.6–15.4)
WBC: 5.7 10*3/uL (ref 3.4–10.8)

## 2018-12-07 LAB — TSH: TSH: 12.03 u[IU]/mL — ABNORMAL HIGH (ref 0.450–4.500)

## 2018-12-08 ENCOUNTER — Other Ambulatory Visit: Payer: Self-pay | Admitting: Nurse Practitioner

## 2018-12-08 MED ORDER — LEVOTHYROXINE SODIUM 88 MCG PO TABS: 88.0000 ug | ORAL_TABLET | Freq: Every day | ORAL | 1 refills | Status: DC

## 2018-12-09 MED FILL — LEVOTHYROXINE 88 MCG TABLET: 88 | 30 days supply | Qty: 30 | Fill #0

## 2018-12-10 ENCOUNTER — Telehealth: Payer: Self-pay

## 2018-12-10 NOTE — Telephone Encounter (Signed)
-----   Message from Claiborne Rigg, NP sent at 12/08/2018  2:14 PM EDT ----- Thyroid level is elevated which means it is underactive. Will need to start a thyroid medication to help your thyroid function better. Your thyroid is a gland located in your neck that has a big responsibility for helping your entire body function normally. You will need to start on synthroid. Will need to recheck your thyroid levels again in 6 weeks. Please make a lab appointment.

## 2018-12-10 NOTE — Telephone Encounter (Signed)
CMA spoke to patient to inform on lab results.  Pt. Verified DOB. Pt. Understood.  Pt. Was also inform to call back to make a lab appt. Once he receive his thyroid medication.

## 2018-12-16 ENCOUNTER — Telehealth: Payer: Self-pay | Admitting: General Practice

## 2018-12-16 NOTE — Telephone Encounter (Signed)
Patient called to inform that he has begun taking the levothyroxine on Friday 05/01. Please follow up.

## 2018-12-20 NOTE — Telephone Encounter (Signed)
If patient call back, please schedule patient a lab appt to recheck Thyroid in 6 weeks from 05/01.  CMA attempt to reach patient to schedule a lab appt. No answer and left a VM for a call back.

## 2018-12-24 ENCOUNTER — Ambulatory Visit: Payer: Self-pay | Attending: Nurse Practitioner

## 2018-12-24 ENCOUNTER — Other Ambulatory Visit: Payer: Self-pay

## 2019-01-28 ENCOUNTER — Other Ambulatory Visit: Payer: Self-pay

## 2019-01-28 ENCOUNTER — Ambulatory Visit: Payer: Self-pay | Attending: Family Medicine

## 2019-01-28 MED FILL — LEVOTHYROXINE 88 MCG TABLET: 88 | 30 days supply | Qty: 30 | Fill #1

## 2019-01-29 ENCOUNTER — Other Ambulatory Visit: Payer: Self-pay | Admitting: Nurse Practitioner

## 2019-01-29 LAB — TSH: TSH: 6.9 u[IU]/mL — ABNORMAL HIGH (ref 0.450–4.500)

## 2019-01-29 MED ORDER — LEVOTHYROXINE SODIUM 100 MCG PO TABS: 100.0000 ug | ORAL_TABLET | Freq: Every day | ORAL | 1 refills | Status: DC

## 2019-01-30 ENCOUNTER — Telehealth: Payer: Self-pay | Admitting: Nurse Practitioner

## 2019-01-30 MED FILL — LEVOTHYROXINE 100 MCG TAB: 100 | 30 days supply | Qty: 30 | Fill #0

## 2019-01-30 NOTE — Progress Notes (Signed)
Patient is aware of lab results and new medication changes.

## 2019-01-30 NOTE — Telephone Encounter (Signed)
CMA inform lab work and will contact patient regarding about his medication being shipped out.

## 2019-01-30 NOTE — Telephone Encounter (Signed)
Pt called says he received a call please follow up, and would like to now if he can get his blood sugar levels checked (it be preferred if he can be called after 3:30)

## 2019-02-12 ENCOUNTER — Encounter: Payer: Self-pay | Admitting: Nurse Practitioner

## 2019-02-12 ENCOUNTER — Ambulatory Visit: Payer: Self-pay | Attending: Nurse Practitioner | Admitting: Nurse Practitioner

## 2019-02-12 ENCOUNTER — Other Ambulatory Visit: Payer: Self-pay

## 2019-02-12 VITALS — BP 132/73 | HR 68 | Temp 98.8°F | Ht 74.0 in | Wt 310.0 lb

## 2019-02-12 DIAGNOSIS — I10 Essential (primary) hypertension: Secondary | ICD-10-CM

## 2019-02-12 MED ORDER — AMLODIPINE BESYLATE 5 MG PO TABS: 5.0000 mg | ORAL_TABLET | Freq: Every day | ORAL | 1 refills | Status: DC

## 2019-02-12 MED ORDER — AMLODIPINE BESYLATE 10 MG PO TABS: 10.0000 mg | ORAL_TABLET | Freq: Every day | ORAL | 3 refills | Status: DC

## 2019-02-12 MED FILL — AMLODIPINE BESYLATE 10 MG T: 10 | 90 days supply | Qty: 90 | Fill #0

## 2019-02-12 NOTE — Progress Notes (Signed)
Assessment & Plan:  Glenn Conrad was seen today for follow-up.  Diagnoses and all orders for this visit:  Essential hypertension -     amLODipine (NORVASC) 5 MG tablet; Take 1 tablet (5 mg total) by mouth daily. Continue amlodipine as prescribed.  Remember to bring in your blood pressure log with you for your follow up appointment.  DASH/Mediterranean Diets are healthier choices for HTN.      Patient has been counseled on age-appropriate routine health concerns for screening and prevention. These are reviewed and up-to-date. Referrals have been placed accordingly. Immunizations are up-to-date or declined.    Subjective:   Chief Complaint  Patient presents with  . Follow-up    Pt. is here for blood pressure check. Pt. stated he feels hot inside his body.    HPI Musa Rewerts 38 y.o. male presents to office today for followup to HTN.   Essential Hypertension Blood pressure well controlled with HCTZ 25 mg however patient states he experienced dizziness and presyncope one day ago at work. Also states he sometimes feels dehydrated at his job. 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 he was sent home from work yesterday due to his symptoms. Orthostatics today in office are negative. He is asymptomatic as well today. He does smoke about 1/4 a pack per day. He currently denies chest pain, shortness of breath, palpitations, lightheadedness, dizziness, headaches or BLE edema.  Will switch HCTZ 25 mg to Amlodipine as patient is at higher risk of dehydration based on work conditions and taking thiazide diuretic.  BP Readings from Last 3 Encounters:  02/12/19 132/73  10/18/18 (!) 142/94  08/24/18 (!) 156/89    Review of Systems  Constitutional: Negative for fever, malaise/fatigue and weight loss.  HENT: Negative.  Negative for nosebleeds.   Eyes: Negative.  Negative for blurred vision, double vision and photophobia.  Respiratory: Negative.   Negative for cough and shortness of breath.   Cardiovascular: Negative.  Negative for chest pain, palpitations and leg swelling.  Gastrointestinal: Negative.  Negative for heartburn, nausea and vomiting.  Musculoskeletal: Negative.  Negative for myalgias.  Neurological: Negative.  Negative for dizziness, focal weakness, seizures and headaches.  Psychiatric/Behavioral: Negative.  Negative for suicidal ideas.    Past Medical History:  Diagnosis Date  . Hypertension   . Thyroid disease     Past Surgical History:  Procedure Laterality Date  . WISDOM TOOTH EXTRACTION      Family History  Problem Relation Age of Onset  . Diabetes Mother   . Hypertension Mother   . Hypertension Father   . Diabetes Father     Social History Reviewed with no changes to be made today.   Outpatient Medications Prior to Visit  Medication Sig Dispense Refill  . levothyroxine (SYNTHROID) 100 MCG tablet Take 1 tablet (100 mcg total) by mouth daily for 30 days. Please send as soon as possible 30 tablet 1  . hydrochlorothiazide (HYDRODIURIL) 25 MG tablet Take 1 tablet (25 mg total) by mouth daily. 90 tablet 0  . hydrOXYzine (ATARAX/VISTARIL) 25 MG tablet Take 1-2 tablets (25-50 mg total) by mouth every 6 (six) hours as needed. (Patient not taking: Reported on 11/27/2018) 30 tablet 0   No facility-administered medications prior to visit.     Allergies  Allergen Reactions  . Lisinopril     Headaches        Objective:    BP 132/73 (BP Location: Right Arm, Patient Position:  Sitting, Cuff Size: Large)   Pulse 68   Temp 98.8 F (37.1 C) (Oral)   Ht 6\' 2"  (1.88 m)   Wt (!) 310 lb (140.6 kg)   SpO2 98%   BMI 39.80 kg/m  Wt Readings from Last 3 Encounters:  02/12/19 (!) 310 lb (140.6 kg)  05/27/18 (!) 319 lb 8 oz (144.9 kg)  02/04/18 (!) 314 lb (142.4 kg)    Physical Exam Vitals signs and nursing note reviewed.  Constitutional:      Appearance: He is well-developed.  HENT:     Head:  Normocephalic and atraumatic.  Neck:     Musculoskeletal: Normal range of motion.  Cardiovascular:     Rate and Rhythm: Normal rate and regular rhythm.     Heart sounds: Normal heart sounds. No murmur. No friction rub. No gallop.   Pulmonary:     Effort: Pulmonary effort is normal. No tachypnea or respiratory distress.     Breath sounds: Normal breath sounds. No decreased breath sounds, wheezing, rhonchi or rales.  Chest:     Chest wall: No tenderness.  Abdominal:     General: Bowel sounds are normal.     Palpations: Abdomen is soft.  Musculoskeletal: Normal range of motion.  Skin:    General: Skin is warm and dry.  Neurological:     Mental Status: He is alert and oriented to person, place, and time.     Coordination: Coordination normal.  Psychiatric:        Behavior: Behavior normal. Behavior is cooperative.        Thought Content: Thought content normal.        Judgment: Judgment normal.          Patient has been counseled extensively about nutrition and exercise as well as the importance of adherence with medications and regular follow-up. The patient was given clear instructions to go to ER or return to medical center if symptoms don't improve, worsen or new problems develop. The patient verbalized understanding.   Follow-up: Return in about 2 weeks (around 02/26/2019) for luke; 4 week TSH LABS; 3 months me.   Claiborne RiggZelda W Annalee Meyerhoff, FNP-BC [facility] [place], [place] [phone]   02/14/2019, 8:30 PM

## 2019-02-14 ENCOUNTER — Encounter: Payer: Self-pay | Admitting: Nurse Practitioner

## 2019-02-17 ENCOUNTER — Ambulatory Visit: Payer: Self-pay | Admitting: Nurse Practitioner

## 2019-02-19 ENCOUNTER — Ambulatory Visit: Payer: Self-pay | Admitting: Nurse Practitioner

## 2019-02-26 ENCOUNTER — Other Ambulatory Visit: Payer: Self-pay

## 2019-02-26 ENCOUNTER — Ambulatory Visit: Payer: Self-pay | Attending: Nurse Practitioner | Admitting: Pharmacist

## 2019-02-26 VITALS — BP 107/75 | HR 60

## 2019-02-26 DIAGNOSIS — I10 Essential (primary) hypertension: Secondary | ICD-10-CM

## 2019-02-26 NOTE — Patient Instructions (Signed)

## 2019-02-26 NOTE — Progress Notes (Signed)
   S:    PCP: Zelda   Patient arrives in good spirits. Presents to the clinic for a BP check. Patient was referred and last seen by PCP on 02/12/19. Zelda switched HCTZ to amlodipine d/t dehydration and dizziness with HCTZ.   Patient reports adherence with medications. Denies chest pain, dyspnea, HA or blurred vision.   Current BP Medications include:  Amlodipine 5 mg daily  Antihypertensives tried in the past include: HCTZ (excessive dizziness, dehydration), lisinopril (HA)  Dietary habits include: does not cook with or add salt at the table; drinks caffeinated beverages occasionally Exercise habits include: none outside of work Family / Social history:  - FHx: DM, HTN (mother, father) - Tobacco: current 0.5 PPD smoker - Alcohol: denies use  O:  Home BP readings: has not checked since changing to amlodipine  Last 3 Office BP readings: BP Readings from Last 3 Encounters:  02/26/19 107/75  02/12/19 132/73  10/18/18 (!) 142/94    BMET    Component Value Date/Time   NA 143 12/06/2018 1545   K 3.9 12/06/2018 1545   CL 99 12/06/2018 1545   CO2 27 12/06/2018 1545   GLUCOSE 97 12/06/2018 1545   GLUCOSE 93 08/16/2017 1315   BUN 11 12/06/2018 1545   CREATININE 0.90 12/06/2018 1545   CALCIUM 9.9 12/06/2018 1545   GFRNONAA 107 12/06/2018 1545   GFRAA 124 12/06/2018 1545   Renal function: CrCl cannot be calculated (Patient's most recent lab result is older than the maximum 21 days allowed.).  Clinical ASCVD: No  The ASCVD Risk score Mikey Bussing DC Jr., et al., 2013) failed to calculate for the following reasons:   The 2013 ASCVD risk score is only valid for ages 41 to 53  A/P: Hypertension longstanding currently controlled on current medications. BP Goal = <130/80 mmHg. Patient is adherent with current medications.  -Continued current regimen.  -Counseled on lifestyle modifications for blood pressure control including reduced dietary sodium, increased exercise, adequate sleep -HM:  tetanus and PNA recommended; pt deferred.    Results reviewed and written information provided.  Total time in face-to-face counseling 15 minutes.   F/U Clinic Visit next month w/ PCP.    Benard Halsted, PharmD, [place] [phone]

## 2019-03-12 ENCOUNTER — Ambulatory Visit: Payer: Self-pay | Attending: Nurse Practitioner

## 2019-03-12 ENCOUNTER — Other Ambulatory Visit: Payer: Self-pay

## 2019-03-13 LAB — TSH: TSH: 3.83 u[IU]/mL (ref 0.450–4.500)

## 2019-03-15 ENCOUNTER — Other Ambulatory Visit: Payer: Self-pay | Admitting: Nurse Practitioner

## 2019-03-15 MED ORDER — LEVOTHYROXINE SODIUM 100 MCG PO TABS: 100.0000 ug | ORAL_TABLET | Freq: Every day | ORAL | 1 refills | Status: DC

## 2019-03-17 MED FILL — LEVOTHYROXINE 100 MCG TAB: 100 | 30 days supply | Qty: 30 | Fill #0

## 2019-03-18 ENCOUNTER — Telehealth: Payer: Self-pay

## 2019-03-18 MED ORDER — LEVOTHYROXINE SODIUM 100 MCG PO TABS: 100.0000 ug | ORAL_TABLET | Freq: Every day | ORAL | 1 refills | Status: DC

## 2019-03-18 NOTE — Telephone Encounter (Signed)
Patient would like to resend his TSH medication to [facility] on [address].  CMA resent the RX.

## 2019-03-18 NOTE — Telephone Encounter (Signed)
-----   Message from [name], NP sent at 03/15/2019  8:14 PM EDT ----- Thyroid level is normal. Will refill synthroid 100 mg.

## 2019-04-04 ENCOUNTER — Ambulatory Visit: Payer: Self-pay | Attending: Nurse Practitioner | Admitting: Nurse Practitioner

## 2019-04-04 ENCOUNTER — Other Ambulatory Visit: Payer: Self-pay

## 2019-04-04 ENCOUNTER — Encounter: Payer: Self-pay | Admitting: Nurse Practitioner

## 2019-04-04 DIAGNOSIS — H00011 Hordeolum externum right upper eyelid: Secondary | ICD-10-CM

## 2019-04-04 DIAGNOSIS — E039 Hypothyroidism, unspecified: Secondary | ICD-10-CM

## 2019-04-04 DIAGNOSIS — I10 Essential (primary) hypertension: Secondary | ICD-10-CM

## 2019-04-04 MED ORDER — LEVOTHYROXINE SODIUM 100 MCG PO TABS: 100.0000 ug | ORAL_TABLET | Freq: Every day | ORAL | 0 refills | Status: DC

## 2019-04-04 MED ORDER — BACITRACIN-POLYMYXIN B 500-10000 UNIT/GM OP OINT: 1.0000 "application " | TOPICAL_OINTMENT | Freq: Three times a day (TID) | OPHTHALMIC | 0 refills | Status: AC

## 2019-04-04 MED ORDER — AMLODIPINE BESYLATE 5 MG PO TABS: 5.0000 mg | ORAL_TABLET | Freq: Every day | ORAL | 1 refills | Status: DC

## 2019-04-04 NOTE — Progress Notes (Signed)
Virtual Visit via Telephone Note Due to national recommendations of social distancing due to COVID 19, telehealth visit is felt to be most appropriate for this patient at this time.  I discussed the limitations, risks, security and privacy concerns of performing an evaluation and management service by telephone and the availability of in person appointments. I also discussed with the patient that there may be a patient responsible charge related to this service. The patient expressed understanding and agreed to proceed.    I connected with Trish FountainStacey Zion on 04/04/19  at   8:50 AM EDT  EDT by telephone and verified that I am speaking with the correct person using two identifiers.   Consent I discussed the limitations, risks, security and privacy concerns of performing an evaluation and management service by telephone and the availability of in person appointments. I also discussed with the patient that there may be a patient responsible charge related to this service. The patient expressed understanding and agreed to proceed.   Location of Patient: Private Residence   [facility]    Persons participating in Telemedicine visit: Bertram DenverZelda Fleming FNP-BC [facility] [place] CMA Trish FountainStacey Bilek    History of Present Illness: Telemedicine visit for: HTN Follow up  ESSENTIAL HYPERTENSION He [facility] with average readings 130-140/80s.  Taking amlodipine 5 mg daily as prescribed.  I have instructed him that if he starts seeing more frequent blood pressure readings greater than 140/90 to increase his amlodipine to 10 mg or 2 tablets. Denies chest pain, shortness of breath, palpitations, lightheadedness, dizziness, headaches or BLE edema.  BP Readings from Last 3 Encounters:  02/26/19 107/75  02/12/19 132/73  10/18/18 (!) 142/94     Right Eye Pain Onset 5 days ago of painful small nodule on top of right eye lid  between eyelashes. He has been applying warm compresses to the area but not consistently. Denies any purulent drainage from the eye.    Past Medical History:  Diagnosis Date  . Hypertension   . Thyroid disease     Past Surgical History:  Procedure Laterality Date  . WISDOM TOOTH EXTRACTION      Family History  Problem Relation Age of Onset  . Diabetes Mother   . Hypertension Mother   . Hypertension Father   . Diabetes Father     Social History   Socioeconomic History  . Marital status: Single    Spouse name: Not on file  . Number of children: Not on file  . Years of education: Not on file  . Highest education level: Not on file  Occupational History  . Not on file  Social Needs  . Financial resource strain: Not on file  . Food insecurity    Worry: Not on file    Inability: Not on file  . Transportation needs    Medical: Not on file    Non-medical: Not on file  Tobacco Use  . Smoking status: Current Every Day Smoker    Packs/day: 0.50  . Smokeless tobacco: Never Used  Substance and Sexual Activity  . Alcohol use: No    Frequency: Never  . Drug use: No  . Sexual activity: Yes  Lifestyle  . Physical activity    Days per week: Not on file    Minutes per session: Not on file  . Stress: Not on file  Relationships  . Social connections    Talks on phone: Not on file  Gets together: Not on file    Attends religious service: Not on file    Active member of club or organization: Not on file    Attends meetings of clubs or organizations: Not on file    Relationship status: Not on file  Other Topics Concern  . Not on file  Social History Narrative  . Not on file     Observations/Objective: Awake, alert and oriented x 3   Review of Systems  Constitutional: Negative for fever, malaise/fatigue and weight loss.  HENT: Negative.  Negative for nosebleeds.   Eyes: Positive for pain. Negative for blurred vision, double vision, photophobia, discharge and redness.        SEE HPI  Respiratory: Negative.  Negative for cough and shortness of breath.   Cardiovascular: Negative.  Negative for chest pain, palpitations and leg swelling.  Gastrointestinal: Negative.  Negative for heartburn, nausea and vomiting.  Musculoskeletal: Negative.  Negative for myalgias.  Neurological: Negative.  Negative for dizziness, focal weakness, seizures and headaches.  Psychiatric/Behavioral: Negative.  Negative for suicidal ideas.    Assessment and Plan:  Diagnoses and all orders for this visit:  Essential hypertension -     amLODipine (NORVASC) 5 MG tablet; Take 1 tablet (5 mg total) by mouth daily. Continue all antihypertensives as prescribed.  Remember to bring in your blood pressure log with you for your follow up appointment.  DASH/Mediterranean Diets are healthier choices for HTN.    Hordeolum of right upper eyelid, unspecified hordeolum type -     bacitracin-polymyxin b (POLYSPORIN) ophthalmic ointment; Place 1 application into the right eye 3 (three) times daily for 7 days.  Hypothyroidism, unspecified type -     levothyroxine (SYNTHROID) 100 MCG tablet; Take 1 tablet (100 mcg total) by mouth daily. Lab Results  Component Value Date   TSH 3.830 03/12/2019       Follow Up Instructions Return in about 3 months (around 07/05/2019).     I discussed the assessment and treatment plan with the patient. The patient was provided an opportunity to ask questions and all were answered. The patient agreed with the plan and demonstrated an understanding of the instructions.   The patient was advised to call back or seek an in-person evaluation if the symptoms worsen or if the condition fails to improve as anticipated.  I provided 16 minutes of non-face-to-face time during this encounter including median intraservice time, reviewing previous notes, labs, imaging, medications and explaining diagnosis and management.  Glenn Pounds, FNP-BC

## 2019-05-16 ENCOUNTER — Ambulatory Visit: Payer: Self-pay | Admitting: Nurse Practitioner

## 2019-05-26 ENCOUNTER — Ambulatory Visit: Payer: Self-pay | Admitting: Nurse Practitioner

## 2019-06-09 ENCOUNTER — Ambulatory Visit: Payer: Self-pay | Attending: Nurse Practitioner | Admitting: Nurse Practitioner

## 2019-06-09 ENCOUNTER — Other Ambulatory Visit: Payer: Self-pay

## 2019-06-09 ENCOUNTER — Encounter: Payer: Self-pay | Admitting: Nurse Practitioner

## 2019-06-09 VITALS — BP 131/78 | HR 63 | Temp 98.7°F | Ht 74.0 in | Wt 308.0 lb

## 2019-06-09 DIAGNOSIS — I10 Essential (primary) hypertension: Secondary | ICD-10-CM

## 2019-06-09 DIAGNOSIS — M79644 Pain in right finger(s): Secondary | ICD-10-CM

## 2019-06-09 DIAGNOSIS — F17200 Nicotine dependence, unspecified, uncomplicated: Secondary | ICD-10-CM

## 2019-06-09 DIAGNOSIS — E669 Obesity, unspecified: Secondary | ICD-10-CM

## 2019-06-09 DIAGNOSIS — E039 Hypothyroidism, unspecified: Secondary | ICD-10-CM

## 2019-06-09 MED ORDER — NAPROXEN 500 MG PO TABS: 500.0000 mg | ORAL_TABLET | Freq: Two times a day (BID) | ORAL | 1 refills | Status: AC

## 2019-06-09 MED ORDER — AMLODIPINE BESYLATE 5 MG PO TABS: 5.0000 mg | ORAL_TABLET | Freq: Every day | ORAL | 6 refills | Status: DC

## 2019-06-09 NOTE — Patient Instructions (Signed)
 Gout  Gout is painful swelling of your joints. Gout is a type of arthritis. It is caused by having too much uric acid in your body. Uric acid is a chemical that is made when your body breaks down substances called purines. If your body has too much uric acid, sharp crystals can form and build up in your joints. This causes pain and swelling. Gout attacks can happen quickly and be very painful (acute gout). Over time, the attacks can affect more joints and happen more often (chronic gout). What are the causes?  Too much uric acid in your blood. This can happen because: ? Your kidneys do not remove enough uric acid from your blood. ? Your body makes too much uric acid. ? You eat too many foods that are high in purines. These foods include organ meats, some seafood, and beer.  Trauma or stress. What increases the risk?  Having a family history of gout.  Being male and middle-aged.  Being male and having gone through menopause.  Being very overweight (obese).  Drinking alcohol, especially beer.  Not having enough water in the body (being dehydrated).  Losing weight too quickly.  Having an organ transplant.  Having lead poisoning.  Taking certain medicines.  Having kidney disease.  Having a skin condition called psoriasis. What are the signs or symptoms? An attack of acute gout usually happens in just one joint. The most common place is the big toe. Attacks often start at night. Other joints that may be affected include joints of the feet, ankle, knee, fingers, wrist, or elbow. Symptoms of an attack may include:  Very bad pain.  Warmth.  Swelling.  Stiffness.  Shiny, red, or purple skin.  Tenderness. The affected joint may be very painful to touch.  Chills and fever. Chronic gout may cause symptoms more often. More joints may be involved. You may also have white or yellow lumps (tophi) on your hands or feet or in other areas near your joints. How is this  treated?  Treatment for this condition has two phases: treating an acute attack and preventing future attacks.  Acute gout treatment may include: ? NSAIDs. ? Steroids. These are taken by mouth or injected into a joint. ? Colchicine. This medicine relieves pain and swelling. It can be given by mouth or through an IV tube.  Preventive treatment may include: ? Taking small doses of NSAIDs or colchicine daily. ? Using a medicine that reduces uric acid levels in your blood. ? Making changes to your diet. You may need to see a food expert (dietitian) about what to eat and drink to prevent gout. Follow these instructions at home: During a gout attack   If told, put ice on the painful area: ? Put ice in a plastic bag. ? Place a towel between your skin and the bag. ? Leave the ice on for 20 minutes, 2-3 times a day.  Raise (elevate) the painful joint above the level of your heart as often as you can.  Rest the joint as much as possible. If the joint is in your leg, you may be given crutches.  Follow instructions from your doctor about what you cannot eat or drink. Avoiding future gout attacks  Eat a low-purine diet. Avoid foods and drinks such as: ? Liver. ? Kidney. ? Anchovies. ? Asparagus. ? Herring. ? Mushrooms. ? Mussels. ? Beer.  Stay at a healthy weight. If you want to lose weight, talk with your doctor. Do not lose   weight too fast.  Start or continue an exercise plan as told by your doctor. Eating and drinking  Drink enough fluids to keep your pee (urine) pale yellow.  If you drink alcohol: ? Limit how much you use to:  0-1 drink a day for women.  0-2 drinks a day for men. ? Be aware of how much alcohol is in your drink. In the [place] one drink equals one 12 oz bottle of beer (355 mL), one 5 oz glass of wine (148 mL), or one 1 oz glass of hard liquor (44 mL). General instructions  Take over-the-counter and prescription medicines only as told by your doctor.  Do  not drive or use heavy machinery while taking prescription pain medicine.  Return to your normal activities as told by your doctor. Ask your doctor what activities are safe for you.  Keep all follow-up visits as told by your doctor. This is important. Contact a doctor if:  You have another gout attack.  You still have symptoms of a gout attack after 10 days of treatment.  You have problems (side effects) because of your medicines.  You have chills or a fever.  You have burning pain when you pee (urinate).  You have pain in your lower back or belly. Get help right away if:  You have very bad pain.  Your pain cannot be controlled.  You cannot pee. Summary  Gout is painful swelling of the joints.  The most common site of pain is the big toe, but it can affect other joints.  Medicines and avoiding some foods can help to prevent and treat gout attacks. This information is not intended to replace advice given to you by your health care provider. Make sure you discuss any questions you have with your health care provider. Document Released: 05/09/2008 Document Revised: 02/20/2018 Document Reviewed: 02/20/2018 Elsevier Patient Education  2020 [employer]   Low-Purine Eating Plan A low-purine eating plan involves making food choices to limit your intake of purine. Purine is a kind of uric acid. Too much uric acid in your blood can cause certain conditions, such as gout and kidney stones. Eating a low-purine diet can help control these conditions. What are tips for following this plan? Reading food labels   Avoid foods with saturated or Trans fat.  Check the ingredient list of grains-based foods, such as bread and cereal, to make sure that they contain whole grains.  Check the ingredient list of sauces or soups to make sure they do not contain meat or fish.  When choosing soft drinks, check the ingredient list to make sure they do not contain high-fructose corn syrup.  Shopping  Buy plenty of fresh fruits and vegetables.  Avoid buying canned or fresh fish.  Buy dairy products labeled as low-fat or nonfat.  Avoid buying premade or processed foods. These foods are often high in fat, salt (sodium), and added sugar. Cooking  Use olive oil instead of butter when cooking. Oils like olive oil, canola oil, and sunflower oil contain healthy fats. Meal planning  Learn which foods do or do not affect you. If you find out that a food tends to cause your gout symptoms to flare up, avoid eating that food. You can enjoy foods that do not cause problems. If you have any questions about a food item, talk with your dietitian or health care provider.  Limit foods high in fat, especially saturated fat. Fat makes it harder for your body to get rid of   uric acid.  Choose foods that are lower in fat and are lean sources of protein. General guidelines  Limit alcohol intake to no more than 1 drink a day for nonpregnant women and 2 drinks a day for men. One drink equals 12 oz of beer, 5 oz of wine, or 1 oz of hard liquor. Alcohol can affect the way your body gets rid of uric acid.  Drink plenty of water to keep your urine clear or pale yellow. Fluids can help remove uric acid from your body.  If directed by your health care provider, take a vitamin C supplement.  Work with your health care provider and dietitian to develop a plan to achieve or maintain a healthy weight. Losing weight can help reduce uric acid in your blood. What foods are recommended? The items listed may not be a complete list. Talk with your dietitian about what dietary choices are best for you. Foods low in purines Foods low in purines do not need to be limited. These include:  All fruits.  All low-purine vegetables, pickles, and olives.  Breads, pasta, rice, cornbread, and popcorn. Cake and other baked goods.  All dairy foods.  Eggs, nuts, and nut butters.  Spices and condiments, such as  salt, herbs, and vinegar.  Plant oils, butter, and margarine.  Water, sugar-free soft drinks, tea, coffee, and cocoa.  Vegetable-based soups, broths, sauces, and gravies. Foods moderate in purines Foods moderate in purines should be limited to the amounts listed.   cup of asparagus, cauliflower, spinach, mushrooms, or green peas, each day.  2/3 cup uncooked oatmeal, each day.   cup dry wheat bran or wheat germ, each day.  2-3 ounces of meat or poultry, each day.  4-6 ounces of shellfish, such as crab, lobster, oysters, or shrimp, each day.  1 cup cooked beans, peas, or lentils, each day.  Soup, broths, or bouillon made from meat or fish. Limit these foods as much as possible. What foods are not recommended? The items listed may not be a complete list. Talk with your dietitian about what dietary choices are best for you. Limit your intake of foods high in purines, including:  Beer and other alcohol.  Meat-based gravy or sauce.  Canned or fresh fish, such as: ? Anchovies, sardines, herring, and tuna. ? Mussels and scallops. ? Codfish, trout, and haddock.  Bacon.  Organ meats, such as: ? Liver or kidney. ? Tripe. ? Sweetbreads (thymus gland or pancreas).  Wild game or goose.  Yeast or yeast extract supplements.  Drinks sweetened with high-fructose corn syrup. Summary  Eating a low-purine diet can help control conditions caused by too much uric acid in the body, such as gout or kidney stones.  Choose low-purine foods, limit alcohol, and limit foods high in fat.  You will learn over time which foods do or do not affect you. If you find out that a food tends to cause your gout symptoms to flare up, avoid eating that food. This information is not intended to replace advice given to you by your health care provider. Make sure you discuss any questions you have with your health care provider. Document Released: 11/25/2010 Document Revised: 07/13/2017 Document  Reviewed: 09/13/2016 Elsevier Patient Education  2020 Elsevier Inc.   

## 2019-06-09 NOTE — Progress Notes (Signed)
Assessment & Plan:  Glenn Conrad was seen today for follow-up.  Diagnoses and all orders for this visit:  Essential hypertension -     amLODipine (NORVASC) 5 MG tablet; Take 1 tablet (5 mg total) by mouth daily. Continue all antihypertensives as prescribed.  Remember to bring in your blood pressure log with you for your follow up appointment.  DASH/Mediterranean Diets are healthier choices for HTN.    Tobacco dependence Glenn Conrad was counseled on the dangers of tobacco use, and was advised to quit. Reviewed strategies to maximize success, including removing cigarettes and smoking materials from environment, stress management and support of family/friends as well as pharmacological alternatives including: Wellbutrin, Chantix, Nicotine patch, Nicotine gum or lozenges. Smoking cessation support: smoking cessation hotline: 1-800-QUIT-NOW.  Smoking cessation classes are also available through [facility]. Call [phone] or visit our website at [email or web address].   A total of 3 minutes was spent on counseling for smoking cessation and Glenn Conrad is not ready to quit.    Obesity (BMI 30-39.9) Discussed diet and exercise for person with BMI 39. Instructed: You must burn more calories than you eat. Losing 5 percent of your body weight should be considered a success. In the longer term, losing more than 15 percent of your body weight and staying at this weight is an extremely good result. However, keep in mind that even losing 5 percent of your body weight leads to important health benefits, so try not to get discouraged if you're not able to lose more than this. Will recheck weight in 3-6 months.   Hypothyroidism, unspecified type -     TSH Lab Results  Component Value Date   TSH 3.830 03/12/2019  Well controlled. Continue synthroid 100 mg daily.   Pain of right middle finger -     Uric Acid -     naproxen (NAPROSYN) 500 MG tablet; Take 1 tablet (500 mg total) by mouth 2  (two) times daily with a meal for 7 days.    Patient has been counseled on age-appropriate routine health concerns for screening and prevention. These are reviewed and up-to-date. Referrals have been placed accordingly. Immunizations are up-to-date or declined.    Subjective:   Chief Complaint  Patient presents with  . Follow-up    Pt. is here for blood pressure check follow up. Pt. stated his right middle finger can be stiff and swollen.    HPI Glenn Conrad 38 y.o. male presents to office today for follow up.  has a past medical history of Hypertension and Thyroid disease.    Essential Hypertension Well controlled. Taking amlodipine 5 mg daily as prescribed. Does not monitor his blood pressure at home. Denies chest pain, shortness of breath, palpitations, lightheadedness, dizziness, headaches or BLE edema.  BP Readings from Last 3 Encounters:  06/09/19 131/78  02/26/19 107/75  02/12/19 132/73     Gout Symptoms Chronic. 5 month onset. Waxing and waning right middle finger pain. Aggravated after eating red meat. There is also associated swelling of the finger.    Review of Systems  Constitutional: Negative for fever, malaise/fatigue and weight loss.  HENT: Negative.  Negative for nosebleeds.   Eyes: Negative.  Negative for blurred vision, double vision and photophobia.  Respiratory: Negative.  Negative for cough and shortness of breath.   Cardiovascular: Negative.  Negative for chest pain, palpitations and leg swelling.  Gastrointestinal: Negative.  Negative for heartburn, nausea and vomiting.  Musculoskeletal: Positive for joint pain. Negative for myalgias.  Neurological:  Negative.  Negative for dizziness, focal weakness, seizures and headaches.  Psychiatric/Behavioral: Negative.  Negative for suicidal ideas.    Past Medical History:  Diagnosis Date  . Hypertension   . Thyroid disease     Past Surgical History:  Procedure Laterality Date  . WISDOM TOOTH EXTRACTION       Family History  Problem Relation Age of Onset  . Diabetes Mother   . Hypertension Mother   . Hypertension Father   . Diabetes Father     Social History Reviewed with no changes to be made today.   Outpatient Medications Prior to Visit  Medication Sig Dispense Refill  . levothyroxine (SYNTHROID) 100 MCG tablet Take 1 tablet (100 mcg total) by mouth daily. 90 tablet 0  . amLODipine (NORVASC) 5 MG tablet Take 1 tablet (5 mg total) by mouth daily. 90 tablet 1  . hydrOXYzine (ATARAX/VISTARIL) 25 MG tablet Take 1-2 tablets (25-50 mg total) by mouth every 6 (six) hours as needed. (Patient not taking: Reported on 11/27/2018) 30 tablet 0   No facility-administered medications prior to visit.     Allergies  Allergen Reactions  . Lisinopril     Headaches        Objective:    BP 131/78 (BP Location: Left Arm, Patient Position: Sitting, Cuff Size: Large)   Pulse 63   Temp 98.7 F (37.1 C) (Oral)   Ht 6\' 2"  (1.88 m)   Wt (!) 308 lb (139.7 kg)   SpO2 97%   BMI 39.54 kg/m  Wt Readings from Last 3 Encounters:  06/09/19 (!) 308 lb (139.7 kg)  02/12/19 (!) 310 lb (140.6 kg)  05/27/18 (!) 319 lb 8 oz (144.9 kg)    Physical Exam Vitals signs and nursing note reviewed.  Constitutional:      Appearance: He is well-developed.  HENT:     Head: Normocephalic and atraumatic.  Neck:     Musculoskeletal: Normal range of motion.  Cardiovascular:     Rate and Rhythm: Normal rate and regular rhythm.     Heart sounds: Normal heart sounds. No murmur. No friction rub. No gallop.   Pulmonary:     Effort: Pulmonary effort is normal. No tachypnea or respiratory distress.     Breath sounds: Normal breath sounds. No decreased breath sounds, wheezing, rhonchi or rales.  Chest:     Chest wall: No tenderness.  Abdominal:     General: Bowel sounds are normal.     Palpations: Abdomen is soft.  Musculoskeletal: Normal range of motion.  Skin:    General: Skin is warm and dry.  Neurological:      Mental Status: He is alert and oriented to person, place, and time.     Coordination: Coordination normal.  Psychiatric:        Behavior: Behavior normal. Behavior is cooperative.        Thought Content: Thought content normal.        Judgment: Judgment normal.        Patient has been counseled extensively about nutrition and exercise as well as the importance of adherence with medications and regular follow-up. The patient was given clear instructions to go to ER or return to medical center if symptoms don't improve, worsen or new problems develop. The patient verbalized understanding.   Follow-up: Return in about 3 months (around 09/09/2019).   [name], FNP-BC [facility] and [facility] [place], [place]   06/09/2019, 2:51 PM

## 2019-06-10 LAB — TSH: TSH: 3.51 u[IU]/mL (ref 0.450–4.500)

## 2019-06-10 LAB — URIC ACID: Uric Acid: 7.4 mg/dL (ref 3.7–8.6)

## 2019-06-20 ENCOUNTER — Telehealth: Payer: Self-pay | Admitting: Nurse Practitioner

## 2019-06-20 NOTE — Telephone Encounter (Signed)
Patient called stating he was prescribed naproxen (NAPROSYN) 500 MG tablet  And patient states it is not working for him. Please f/u

## 2019-06-21 ENCOUNTER — Other Ambulatory Visit: Payer: Self-pay | Admitting: Nurse Practitioner

## 2019-06-21 MED ORDER — PREDNISONE 20 MG PO TABS: 20.0000 mg | ORAL_TABLET | Freq: Every day | ORAL | 0 refills | Status: AC

## 2019-06-21 NOTE — Telephone Encounter (Signed)
Prednisone has been sent. If this is not effective he will need to be seen by a hand surgeon

## 2019-06-23 NOTE — Telephone Encounter (Signed)
Patient was called and informed about being prescribed prednisone.

## 2019-12-02 ENCOUNTER — Encounter (HOSPITAL_COMMUNITY): Payer: Self-pay | Admitting: Emergency Medicine

## 2019-12-02 ENCOUNTER — Emergency Department (HOSPITAL_COMMUNITY)
Admission: EM | Admit: 2019-12-02 | Discharge: 2019-12-02 | Disposition: A | Discharge status: Home / Self Care | Payer: Self-pay | Attending: Emergency Medicine | Admitting: Emergency Medicine

## 2019-12-02 ENCOUNTER — Emergency Department (HOSPITAL_COMMUNITY): Payer: Self-pay

## 2019-12-02 DIAGNOSIS — R079 Chest pain, unspecified: Secondary | ICD-10-CM | POA: Insufficient documentation

## 2019-12-02 DIAGNOSIS — Z5321 Procedure and treatment not carried out due to patient leaving prior to being seen by health care provider: Secondary | ICD-10-CM | POA: Insufficient documentation

## 2019-12-02 LAB — CBC
HCT: 41.8 % (ref 39.0–52.0)
Hemoglobin: 13.6 g/dL (ref 13.0–17.0)
MCH: 30 pg (ref 26.0–34.0)
MCHC: 32.5 g/dL (ref 30.0–36.0)
MCV: 92.1 fL (ref 80.0–100.0)
Platelets: 224 10*3/uL (ref 150–400)
RBC: 4.54 MIL/uL (ref 4.22–5.81)
RDW: 13 % (ref 11.5–15.5)
WBC: 9.6 10*3/uL (ref 4.0–10.5)
nRBC: 0 % (ref 0.0–0.2)

## 2019-12-02 LAB — BASIC METABOLIC PANEL
Anion gap: 11 (ref 5–15)
BUN: 16 mg/dL (ref 6–20)
CO2: 27 mmol/L (ref 22–32)
Calcium: 9.2 mg/dL (ref 8.9–10.3)
Chloride: 101 mmol/L (ref 98–111)
Creatinine, Ser: 1.01 mg/dL (ref 0.61–1.24)
GFR calc Af Amer: >60 mL/min (ref >60)
GFR calc non Af Amer: >60 mL/min (ref >60)
Glucose, Bld: 111 mg/dL — ABNORMAL HIGH (ref 70–99)
Potassium: 3.3 mmol/L — ABNORMAL LOW (ref 3.5–5.1)
Sodium: 139 mmol/L (ref 135–145)

## 2019-12-02 LAB — TROPONIN I (HIGH SENSITIVITY): Troponin I (High Sensitivity): 4 ng/L (ref <18)

## 2019-12-02 MED ORDER — SODIUM CHLORIDE 0.9% FLUSH: 3.0000 mL | Freq: Once | INTRAVENOUS | Status: DC

## 2019-12-02 NOTE — ED Notes (Addendum)
Pt did not answer when called for a room in the lobby, men's bathroom, or in front of the ED.

## 2019-12-02 NOTE — ED Notes (Signed)
Pt did not answer when called for a room. He was not in the men's bathroom or out in the parking lot.

## 2019-12-02 NOTE — ED Triage Notes (Signed)
Pt arriving with complaint of chest pain that began around 1030pm. Pt has hx of HTN. Pt reports having difficulty taking a deep breath due to pain.

## 2019-12-06 ENCOUNTER — Emergency Department (HOSPITAL_COMMUNITY): Payer: Self-pay

## 2019-12-06 ENCOUNTER — Encounter (HOSPITAL_COMMUNITY): Payer: Self-pay

## 2019-12-06 ENCOUNTER — Other Ambulatory Visit: Payer: Self-pay

## 2019-12-06 DIAGNOSIS — Z20822 Contact with and (suspected) exposure to covid-19: Secondary | ICD-10-CM | POA: Diagnosis present

## 2019-12-06 DIAGNOSIS — I82461 Acute embolism and thrombosis of right calf muscular vein: Secondary | ICD-10-CM | POA: Diagnosis present

## 2019-12-06 DIAGNOSIS — E669 Obesity, unspecified: Secondary | ICD-10-CM | POA: Diagnosis present

## 2019-12-06 DIAGNOSIS — Z8249 Family history of ischemic heart disease and other diseases of the circulatory system: Secondary | ICD-10-CM

## 2019-12-06 DIAGNOSIS — F17210 Nicotine dependence, cigarettes, uncomplicated: Secondary | ICD-10-CM | POA: Diagnosis present

## 2019-12-06 DIAGNOSIS — I2694 Multiple subsegmental pulmonary emboli without acute cor pulmonale: Principal | ICD-10-CM | POA: Diagnosis present

## 2019-12-06 DIAGNOSIS — Z79890 Hormone replacement therapy (postmenopausal): Secondary | ICD-10-CM

## 2019-12-06 DIAGNOSIS — Z888 Allergy status to other drugs, medicaments and biological substances status: Secondary | ICD-10-CM

## 2019-12-06 DIAGNOSIS — I119 Hypertensive heart disease without heart failure: Secondary | ICD-10-CM | POA: Diagnosis present

## 2019-12-06 DIAGNOSIS — Z6841 Body Mass Index (BMI) 40.0 and over, adult: Secondary | ICD-10-CM

## 2019-12-06 DIAGNOSIS — E039 Hypothyroidism, unspecified: Secondary | ICD-10-CM | POA: Diagnosis present

## 2019-12-06 DIAGNOSIS — Z23 Encounter for immunization: Secondary | ICD-10-CM

## 2019-12-06 DIAGNOSIS — Z79899 Other long term (current) drug therapy: Secondary | ICD-10-CM

## 2019-12-06 LAB — BASIC METABOLIC PANEL
Anion gap: 10 (ref 5–15)
BUN: 12 mg/dL (ref 6–20)
CO2: 28 mmol/L (ref 22–32)
Calcium: 9.1 mg/dL (ref 8.9–10.3)
Chloride: 98 mmol/L (ref 98–111)
Creatinine, Ser: 0.99 mg/dL (ref 0.61–1.24)
GFR calc Af Amer: >60 mL/min (ref >60)
GFR calc non Af Amer: >60 mL/min (ref >60)
Glucose, Bld: 124 mg/dL — ABNORMAL HIGH (ref 70–99)
Potassium: 4 mmol/L (ref 3.5–5.1)
Sodium: 136 mmol/L (ref 135–145)

## 2019-12-06 LAB — CBC
HCT: 39.4 % (ref 39.0–52.0)
Hemoglobin: 13 g/dL (ref 13.0–17.0)
MCH: 30.4 pg (ref 26.0–34.0)
MCHC: 33 g/dL (ref 30.0–36.0)
MCV: 92.3 fL (ref 80.0–100.0)
Platelets: 235 10*3/uL (ref 150–400)
RBC: 4.27 MIL/uL (ref 4.22–5.81)
RDW: 12.5 % (ref 11.5–15.5)
WBC: 10.5 10*3/uL (ref 4.0–10.5)
nRBC: 0 % (ref 0.0–0.2)

## 2019-12-06 LAB — TROPONIN I (HIGH SENSITIVITY): Troponin I (High Sensitivity): 4 ng/L (ref <18)

## 2019-12-06 NOTE — ED Triage Notes (Signed)
Chest pain for approx 1 week. Pt sts it is right sided and radiatesto ribs and back. Worst when coughing or sneezing.

## 2019-12-07 ENCOUNTER — Inpatient Hospital Stay (HOSPITAL_COMMUNITY)
Admission: EM | Admit: 2019-12-07 | Discharge: 2019-12-08 | DRG: 176 | Disposition: A | Discharge status: Home / Self Care | Payer: Self-pay | Attending: Internal Medicine | Admitting: Internal Medicine

## 2019-12-07 ENCOUNTER — Encounter (HOSPITAL_COMMUNITY): Payer: Self-pay

## 2019-12-07 ENCOUNTER — Observation Stay (HOSPITAL_COMMUNITY): Payer: Self-pay

## 2019-12-07 ENCOUNTER — Emergency Department (HOSPITAL_COMMUNITY): Payer: Self-pay

## 2019-12-07 DIAGNOSIS — R079 Chest pain, unspecified: Secondary | ICD-10-CM

## 2019-12-07 DIAGNOSIS — I2694 Multiple subsegmental pulmonary emboli without acute cor pulmonale: Principal | ICD-10-CM

## 2019-12-07 DIAGNOSIS — I2699 Other pulmonary embolism without acute cor pulmonale: Secondary | ICD-10-CM

## 2019-12-07 DIAGNOSIS — I10 Essential (primary) hypertension: Secondary | ICD-10-CM | POA: Diagnosis present

## 2019-12-07 DIAGNOSIS — R0602 Shortness of breath: Secondary | ICD-10-CM

## 2019-12-07 DIAGNOSIS — F17200 Nicotine dependence, unspecified, uncomplicated: Secondary | ICD-10-CM | POA: Insufficient documentation

## 2019-12-07 LAB — APTT: aPTT: 33 seconds (ref 24–36)

## 2019-12-07 LAB — D-DIMER, QUANTITATIVE: D-Dimer, Quant: 5.45 ug/mL-FEU — ABNORMAL HIGH (ref 0.00–0.50)

## 2019-12-07 LAB — RESPIRATORY PANEL BY RT PCR (FLU A&B, COVID)
Influenza A by PCR: NEGATIVE
Influenza B by PCR: NEGATIVE
SARS Coronavirus 2 by RT PCR: NEGATIVE

## 2019-12-07 LAB — PROTIME-INR
INR: 1.1 (ref 0.8–1.2)
Prothrombin Time: 14.3 seconds (ref 11.4–15.2)

## 2019-12-07 LAB — TROPONIN I (HIGH SENSITIVITY): Troponin I (High Sensitivity): 3 ng/L (ref <18)

## 2019-12-07 LAB — HEPARIN LEVEL (UNFRACTIONATED)
Heparin Unfractionated: 0.36 IU/mL (ref 0.30–0.70)
Heparin Unfractionated: 0.25 IU/mL — ABNORMAL LOW (ref 0.30–0.70)

## 2019-12-07 LAB — ECHOCARDIOGRAM COMPLETE

## 2019-12-07 LAB — POC SARS CORONAVIRUS 2 AG -  ED: SARS Coronavirus 2 Ag: NEGATIVE

## 2019-12-07 LAB — BRAIN NATRIURETIC PEPTIDE: B Natriuretic Peptide: 28.4 pg/mL (ref 0.0–100.0)

## 2019-12-07 MED ORDER — NICOTINE 14 MG/24HR TD PT24
14.0000 mg | MEDICATED_PATCH | Freq: Once | TRANSDERMAL | Status: AC
  Administered 2019-12-07: 14 mg via TRANSDERMAL
  Filled 2019-12-07: qty 1

## 2019-12-07 MED ORDER — POLYETHYLENE GLYCOL 3350 17 G PO PACK: 17.0000 g | PACK | Freq: Every day | ORAL | Status: DC | PRN

## 2019-12-07 MED ORDER — MORPHINE SULFATE (PF) 4 MG/ML IV SOLN
4.0000 mg | Freq: Once | INTRAVENOUS | Status: AC
  Administered 2019-12-07: 03:00:00 4 mg via INTRAVENOUS
  Filled 2019-12-07: qty 1

## 2019-12-07 MED ORDER — IOHEXOL 350 MG/ML SOLN
100.0000 mL | Freq: Once | INTRAVENOUS | Status: AC | PRN
  Administered 2019-12-07: 100 mL via INTRAVENOUS

## 2019-12-07 MED ORDER — AEROCHAMBER PLUS FLO-VU MEDIUM MISC
1.0000 | Freq: Once | Status: AC
  Administered 2019-12-07: 1
  Filled 2019-12-07: qty 1

## 2019-12-07 MED ORDER — SODIUM CHLORIDE 0.9 % IV SOLN: 250.0000 mL | INTRAVENOUS | Status: DC | PRN

## 2019-12-07 MED ORDER — SODIUM CHLORIDE 0.9% FLUSH: 3.0000 mL | Freq: Two times a day (BID) | INTRAVENOUS | Status: DC

## 2019-12-07 MED ORDER — ACETAMINOPHEN 325 MG PO TABS: 650.0000 mg | ORAL_TABLET | Freq: Four times a day (QID) | ORAL | Status: DC | PRN

## 2019-12-07 MED ORDER — ONDANSETRON HCL 4 MG PO TABS: 4.0000 mg | ORAL_TABLET | Freq: Four times a day (QID) | ORAL | Status: DC | PRN

## 2019-12-07 MED ORDER — HEPARIN (PORCINE) 25000 UT/250ML-% IV SOLN
2100.0000 [IU]/h | INTRAVENOUS | Status: DC
  Administered 2019-12-07 (×2): 2100 [IU]/h via INTRAVENOUS
  Filled 2019-12-07 (×2): qty 250

## 2019-12-07 MED ORDER — HEPARIN BOLUS VIA INFUSION
4000.0000 [IU] | Freq: Once | INTRAVENOUS | Status: AC
  Administered 2019-12-07: 4000 [IU] via INTRAVENOUS
  Filled 2019-12-07: qty 4000

## 2019-12-07 MED ORDER — HYDROCODONE-ACETAMINOPHEN 5-325 MG PO TABS
1.0000 | ORAL_TABLET | ORAL | Status: DC | PRN
  Administered 2019-12-07 – 2019-12-08 (×4): 1 via ORAL
  Filled 2019-12-07 (×4): qty 1

## 2019-12-07 MED ORDER — ONDANSETRON HCL 4 MG/2ML IJ SOLN: 4.0000 mg | Freq: Four times a day (QID) | INTRAMUSCULAR | Status: DC | PRN

## 2019-12-07 MED ORDER — SODIUM CHLORIDE (PF) 0.9 % IJ SOLN
INTRAMUSCULAR | Status: AC
  Administered 2019-12-07: 10 mL
  Filled 2019-12-07: qty 50

## 2019-12-07 MED ORDER — ALBUTEROL SULFATE HFA 108 (90 BASE) MCG/ACT IN AERS
8.0000 | INHALATION_SPRAY | Freq: Once | RESPIRATORY_TRACT | Status: AC
  Administered 2019-12-07: 8 via RESPIRATORY_TRACT
  Filled 2019-12-07: qty 6.7

## 2019-12-07 MED ORDER — PNEUMOCOCCAL VAC POLYVALENT 25 MCG/0.5ML IJ INJ
0.5000 mL | INJECTION | INTRAMUSCULAR | Status: AC
  Administered 2019-12-08: 0.5 mL via INTRAMUSCULAR
  Filled 2019-12-07: qty 0.5

## 2019-12-07 MED ORDER — MORPHINE SULFATE (PF) 4 MG/ML IV SOLN
4.0000 mg | INTRAVENOUS | Status: DC | PRN
  Administered 2019-12-07 – 2019-12-08 (×5): 4 mg via INTRAVENOUS
  Filled 2019-12-07 (×5): qty 1

## 2019-12-07 MED ORDER — SODIUM CHLORIDE 0.9% FLUSH: 3.0000 mL | INTRAVENOUS | Status: DC | PRN

## 2019-12-07 MED ORDER — HEPARIN (PORCINE) 25000 UT/250ML-% IV SOLN
2400.0000 [IU]/h | INTRAVENOUS | Status: AC
  Administered 2019-12-07 – 2019-12-08 (×2): 2400 [IU]/h via INTRAVENOUS
  Filled 2019-12-07: qty 250

## 2019-12-07 MED ORDER — MORPHINE SULFATE (PF) 4 MG/ML IV SOLN
4.0000 mg | Freq: Once | INTRAVENOUS | Status: AC
  Administered 2019-12-07: 4 mg via INTRAVENOUS
  Filled 2019-12-07: qty 1

## 2019-12-07 NOTE — Progress Notes (Signed)
  Echocardiogram 2D Echocardiogram has been performed.  Celene Skeen 12/07/2019, 1:41 PM

## 2019-12-07 NOTE — ED Notes (Signed)
Patient given lunch tray.

## 2019-12-07 NOTE — Progress Notes (Signed)
ANTICOAGULATION CONSULT NOTE - Initial Consult  Pharmacy Consult for Heparin Indication: pulmonary embolus  Allergies  Allergen Reactions  . Lisinopril     Headaches     Patient Measurements: Height: 6\' 2"  (188 cm) Weight: (!) 141.5 kg (312 lb) IBW/kg (Calculated) : 82.2 Heparin Dosing Weight:   Vital Signs: Temp: 98.8 F (37.1 C) (04/24 2147) Temp Source: Oral (04/24 2147) BP: 140/89 (04/25 0535) Pulse Rate: 94 (04/25 0535)  Labs: Recent Labs    12/06/19 2222 12/07/19 0103  HGB 13.0  --   HCT 39.4  --   PLT 235  --   CREATININE 0.99  --   TROPONINIHS 4 3    Estimated Creatinine Clearance: 148.6 mL/min (by C-G formula based on SCr of 0.99 mg/dL).   Medical History: Past Medical History:  Diagnosis Date  . Hypertension   . Thyroid disease     Medications:  Infusions:  . heparin      Assessment: Patient with PE noted on CT along with the following noted"numerous right-sided pulmonary emboli, associated pulmonary infarct and hemorrhage"   Spoke with ED PA and she instructed to dose heparin as normal and continue to monitor for bleeding.  ED PA noted she had discussed with intensivist.   No oral anticoagulants noted on med rec.   Baseline coags ordered.  Goal of Therapy:  Heparin level 0.3-0.7 units/ml Monitor platelets by anticoagulation protocol: Yes   Plan:  Heparin bolus 4000 units iv x1 Heparin drip at 2100 units/hr Daily CBC Next heparin level at [address] [place] 12/07/2019,6:11 AM

## 2019-12-07 NOTE — ED Provider Notes (Signed)
[facility]-EMERGENCY DEPT Provider Note   CSN: [id number] Arrival date & time: 12/06/19  2136     History Chief Complaint  Patient presents with  . Chest Pain    Glenn Conrad is a 39 y.o. male with a hx of hypertension, hypothyroid presents to the Emergency Department complaining of gradual, persistent, progressively worsening chest pain with associated shortness of breath onset approximately 1 week ago.  Patient reports the chest pain is central, sharp and constant.  He reports coughing, taking a deep breath or moving make it significantly worse.  He reports generalized body aches, fatigue and generalized weakness.  He reports he is a smoker, denies regular alcohol or drug usage.  Denies known Covid contacts.  Denies fever, chills, headache, neck pain, abdominal pain, nausea, vomiting, diarrhea, peripheral edema, palpitations.  Patient reports that moving around, coughing, deep breathing also make pain significantly worse.    The history is provided by the patient, medical records and a significant other. No language interpreter was used.       Past Medical History:  Diagnosis Date  . Hypertension   . Thyroid disease     Patient Active Problem List   Diagnosis Date Noted  . Acute pulmonary embolus (HCC) 12/07/2019  . Hypertension   . Current smoker     Past Surgical History:  Procedure Laterality Date  . WISDOM TOOTH EXTRACTION         Family History  Problem Relation Age of Onset  . Diabetes Mother   . Hypertension Mother   . Hypertension Father   . Diabetes Father     Social History   Tobacco Use  . Smoking status: Current Every Day Smoker    Packs/day: 0.50  . Smokeless tobacco: Never Used  Substance Use Topics  . Alcohol use: No  . Drug use: No    Home Medications Prior to Admission medications   Medication Sig Start Date End Date Taking? Authorizing Provider  amLODipine (NORVASC) 5 MG tablet Take 1 tablet (5 mg total) by  mouth daily. 06/09/19 12/07/19 Yes Claiborne Rigg, NP  hydrOXYzine (ATARAX/VISTARIL) 25 MG tablet Take 1-2 tablets (25-50 mg total) by mouth every 6 (six) hours as needed. Patient not taking: Reported on 11/27/2018 06/16/18   Gilda Crease, MD  levothyroxine (SYNTHROID) 100 MCG tablet Take 1 tablet (100 mcg total) by mouth daily. 04/04/19 07/03/19  Claiborne Rigg, NP    Allergies    Lisinopril  Review of Systems   Review of Systems  Constitutional: Positive for fatigue. Negative for appetite change, diaphoresis, fever and unexpected weight change.  HENT: Negative for mouth sores.   Eyes: Negative for visual disturbance.  Respiratory: Positive for chest tightness and shortness of breath. Negative for cough and wheezing.   Cardiovascular: Positive for chest pain.  Gastrointestinal: Negative for abdominal pain, constipation, diarrhea, nausea and vomiting.  Endocrine: Negative for polydipsia, polyphagia and polyuria.  Genitourinary: Negative for dysuria, frequency, hematuria and urgency.  Musculoskeletal: Positive for myalgias. Negative for back pain and neck stiffness.  Skin: Negative for rash.  Allergic/Immunologic: Negative for immunocompromised state.  Neurological: Negative for syncope, light-headedness and headaches.  Hematological: Does not bruise/bleed easily.  Psychiatric/Behavioral: Negative for sleep disturbance. The patient is not nervous/anxious.     Physical Exam Updated Vital Signs BP (!) 146/88   Pulse 81   Temp 98.8 F (37.1 C) (Oral)   Resp 16   Ht 6\' 2"  (1.88 m)   Wt (!) 141.5 kg  SpO2 91%   BMI 40.06 kg/m   Physical Exam Vitals and nursing note reviewed.  Constitutional:      General: He is not in acute distress.    Appearance: He is not diaphoretic.  HENT:     Head: Normocephalic.  Eyes:     General: No scleral icterus.    Conjunctiva/sclera: Conjunctivae normal.  Cardiovascular:     Rate and Rhythm: Normal rate and regular rhythm.      Pulses: Normal pulses.          Radial pulses are 2+ on the right side and 2+ on the left side.  Pulmonary:     Effort: No tachypnea, accessory muscle usage, prolonged expiration, respiratory distress or retractions.     Breath sounds: No stridor. Decreased breath sounds present.     Comments: Equal chest rise. No increased work of breathing.  Significantly diminished breath sounds throughout. Abdominal:     General: There is no distension.     Palpations: Abdomen is soft.     Tenderness: There is no abdominal tenderness. There is no guarding or rebound.  Musculoskeletal:     Cervical back: Normal range of motion.     Comments: Moves all extremities equally and without difficulty.  Skin:    General: Skin is warm and dry.     Capillary Refill: Capillary refill takes less than 2 seconds.  Neurological:     Mental Status: He is alert.     GCS: GCS eye subscore is 4. GCS verbal subscore is 5. GCS motor subscore is 6.     Comments: Speech is clear and goal oriented.  Psychiatric:        Mood and Affect: Mood normal.     ED Results / Procedures / Treatments   Labs (all labs ordered are listed, but only abnormal results are displayed) Labs Reviewed  BASIC METABOLIC PANEL - Abnormal; Notable for the following components:      Result Value   Glucose, Bld 124 (*)    All other components within normal limits  D-DIMER, QUANTITATIVE (NOT AT [facility]) - Abnormal; Notable for the following components:   D-Dimer, Quant 5.45 (*)    All other components within normal limits  RESPIRATORY PANEL BY RT PCR (FLU A&B, COVID)  CBC  BRAIN NATRIURETIC PEPTIDE  APTT  PROTIME-INR  HEPARIN LEVEL (UNFRACTIONATED)  POC SARS CORONAVIRUS 2 AG -  ED  TROPONIN I (HIGH SENSITIVITY)  TROPONIN I (HIGH SENSITIVITY)    EKG ED ECG REPORT   Date: 12/07/2019  Rate: 93  Rhythm: normal sinus rhythm  QRS Axis: normal  Intervals: normal  ST/T Wave abnormalities: nonspecific ST changes  Conduction  Disutrbances:none  Narrative Interpretation: This rhythm with nonspecific T abnormalities -inverted T waves present on EKG from 12/02/2019; however new from 08/16/2017  Old EKG Reviewed: unchanged  I have personally reviewed the EKG tracing and agree with the computerized printout as noted.   Radiology DG Chest 2 View  Result Date: 12/06/2019 CLINICAL DATA:  Chest pain EXAM: CHEST - 2 VIEW COMPARISON:  December 02, 2019 FINDINGS: The heart size and mediastinal contours are within normal limits. There is mild prominence of the central pulmonary vasculature. Streaky atelectasis seen in the right mid lung. No large airspace consolidation or pleural effusion. No acute osseous abnormality. IMPRESSION: Prominence of the central pulmonary vasculature which may be due to mild pulmonary vascular congestion. Streaky atelectasis in the mid right lung. Electronically Signed   By: Heywood BeneBindu  Avutu M.D.  On: 12/06/2019 22:07   CT Angio Chest PE W and/or Wo Contrast  Result Date: 12/07/2019 CLINICAL DATA:  39 year old male with chest pain for 1 week. Pain is most severe on the right side with radiation to the ribs and back. Most severe when coughing or sneezing. EXAM: CT ANGIOGRAPHY CHEST WITH CONTRAST TECHNIQUE: Multidetector CT imaging of the chest was performed using the standard protocol during bolus administration of intravenous contrast. Multiplanar CT image reconstructions and MIPs were obtained to evaluate the vascular anatomy. CONTRAST:  OMNIPAQUE IOHEXOL 350 MG/ML SOLN COMPARISON:  No priors. FINDINGS: Comment: Today's study is slightly limited by patient respiratory motion. Cardiovascular: Several filling defects are noted within right sided pulmonary artery branches including both segmental and subsegmental sized branches to the right upper and right lower lobes, compatible with pulmonary embolism. In the right lower lobe there is some occlusive embolus extending to the posterior basal segment (axial image  138 of series 5). Heart size is borderline enlarged. There is no significant pericardial fluid, thickening or pericardial calcification. No atherosclerotic calcifications in the thoracic aorta or the coronary arteries. Mediastinum/Nodes: Multiple borderline enlarged mediastinal lymph nodes. Mildly enlarged right hilar lymph node measuring up to 1.5 cm in short axis. Esophagus is unremarkable in appearance. No axillary lymphadenopathy. Lungs/Pleura: Peripheral airspace consolidation in the posterior aspect of the right lower lobe likely to reflect pulmonary hemorrhage in the setting of pulmonary infarction. There is a small right pleural effusion adjacent to this. Linear scarring or subsegmental atelectasis in the right upper lobe peripherally. Mild diffuse bronchial wall thickening with mild paraseptal emphysema. Upper Abdomen: Unremarkable. Musculoskeletal: There are no aggressive appearing lytic or blastic lesions noted in the visualized portions of the skeleton. Review of the MIP images confirms the above findings. IMPRESSION: 1. Study is positive for acute pulmonary infarction in the right lung, most severe in the right lower lobe where there is an occlusive embolus and probable area of pulmonary hemorrhage in the posterior aspect of the right lower lobe from pulmonary infarction. 2. Small right pleural effusion lying dependently. 3. Mildly enlarged right hilar lymph node and borderline enlarged mediastinal lymph nodes. These are nonspecific and potentially reactive. 4. Mild diffuse bronchial wall thickening with mild paraseptal emphysema; imaging findings suggestive of underlying COPD. Emphysema (ICD10-J43.9). Electronically Signed   By: Trudie Reed M.D.   On: 12/07/2019 04:38    Procedures .Critical Care Performed by: Dierdre Forth, PA-C Authorized by: Dierdre Forth, PA-C   Critical care provider statement:    Critical care time (minutes):  45   Critical care time was exclusive of:   Separately billable procedures and treating other patients and teaching time   Critical care was necessary to treat or prevent imminent or life-threatening deterioration of the following conditions:  Circulatory failure   Critical care was time spent personally by me on the following activities:  Discussions with consultants, evaluation of patient's response to treatment, examination of patient, ordering and performing treatments and interventions, ordering and review of laboratory studies, ordering and review of radiographic studies, pulse oximetry, re-evaluation of patient's condition, obtaining history from patient or surrogate and review of old charts   I assumed direction of critical care for this patient from another provider in my specialty: no     (including critical care time)  Medications Ordered in ED Medications  morphine 4 MG/ML injection 4 mg (has no administration in time range)  acetaminophen (TYLENOL) tablet 650 mg (has no administration in time range)  HYDROcodone-acetaminophen (NORCO/VICODIN) 5-325  MG per tablet 1-2 tablet (has no administration in time range)  heparin ADULT infusion 100 units/mL (25000 units/228mL sodium chloride 0.45%) (2,100 Units/hr Intravenous New Bag/Given 12/07/19 0626)  nicotine (NICODERM CQ - dosed in mg/24 hours) patch 14 mg (14 mg Transdermal Patch Applied 12/07/19 0619)  morphine 4 MG/ML injection 4 mg (4 mg Intravenous Given 12/07/19 0319)  albuterol (VENTOLIN HFA) 108 (90 Base) MCG/ACT inhaler 8 puff (8 puffs Inhalation Given 12/07/19 0338)  AeroChamber Plus Flo-Vu Medium MISC 1 each (1 each Other Given 12/07/19 0339)  iohexol (OMNIPAQUE) 350 MG/ML injection 100 mL (100 mLs Intravenous Contrast Given 12/07/19 0410)  sodium chloride (PF) 0.9 % injection (10 mLs  Given 12/07/19 0553)  morphine 4 MG/ML injection 4 mg (4 mg Intravenous Given 12/07/19 0553)  heparin bolus via infusion 4,000 Units (4,000 Units Intravenous Bolus from Bag 12/07/19 0998)    ED  Course  I have reviewed the triage vital signs and the nursing notes.  Pertinent labs & imaging results that were available during my care of the patient were reviewed by me and considered in my medical decision making (see chart for details).  Clinical Course as of Dec 07 638  Sun Dec 07, 2019  3382 Very high.  CT angiogram ordered.  D-Dimer, Quant(!): 5.45 [HM]  0348 Tachycardic on arrival however no tachycardia on my exam.  Pulse Rate(!): 107 [HM]  0348 Initial and repeat troponin within normal limits.  Troponin I (High Sensitivity): 3 [HM]  [id number] Discussed with PCCM who recommends going ahead and giving heparin for acute PE even in light of pulmonary hemorrhage.  Given stable vital signs, patient can be admitted to the floor and does not need to go to the ICU at this time.   [HM]    Clinical Course User Index [HM] Emelia Sandoval, Boyd Kerbs   MDM Rules/Calculators/A&P                       Presents with chest pain or shortness of breath for 1 week.  Generalized body aches.  Heart score 3 and initial and repeat troponin are within normal limits.  Less likely acute coronary syndrome.  Concern for possible Covid and pulmonary embolism.  D-dimer significantly elevated, CT angiogram ordered.  Of note, EKG with diffusely inverted T waves.  This is unchanged from the last several days as patient has presented to the emergency room twice before today for similar symptoms but has not waited to be evaluated by provider.  Patient's inverted T waves are new from his previous in 2019.  5:51 AM CT angiogram with numerous right-sided pulmonary emboli, associated pulmonary infarct and hemorrhage.  I personally evaluated these images.  Patient's vital signs are remarkably stable.  He is without tachycardia at rest or hypoxia.  He does continue to have chest pain.  Discussed findings and plan for admission with patient who is in agreement with the plan.  Also discussed CT findings with intensivist who  recommends treating with heparin just like of PE without infarct or hemorrhage however there is an increased risk of bleeding.  Discussed patient's case with hospitalist, Dr. Antionette Char.  I have recommended admission and patient (and family if present) agree with this plan. Admitting physician will place admission orders.    Final Clinical Impression(s) / ED Diagnoses Final diagnoses:  Multiple subsegmental pulmonary emboli without acute cor pulmonale (HCC)  Central chest pain  Shortness of breath    Rx / DC Orders ED Discharge Orders  None       Agapito Games 12/07/19 0641    Shanon Rosser, MD 12/07/19 [phone]

## 2019-12-07 NOTE — H&P (Signed)
History and Physical    Glenn Conrad SAY:[id number] DOB: 09-20-1980 DOA: 12/07/2019  PCP: Claiborne Rigg, NP   Patient coming from: Home   Chief Complaint: Chest pain   HPI: Glenn Conrad is a 39 y.o. male with medical history significant for hypertension and tobacco abuse, now presenting to the emergency department with several days of chest pain.  Patient reports almost a week of chest pain, mainly on the right side, radiating towards his back, and worse with deep inspiration, cough, or sneeze.  He does not feel particularly short of breath.  Cough has been nonproductive.  He denies any lower extremity swelling or tenderness.  He denies any personal history of DVT or PE and is not aware of any family history of this either.  He denies any recent prolonged immobilization or surgery.  He continues to smoke approximately 2 packs/week but has cut back.  He denies any known history of asthma or COPD.  ED Course: Upon arrival to the ED, patient is found to be afebrile, saturating mid 90s on room air, and with mild tachycardia.  Chemistry panel and CBC are unremarkable, high-sensitivity troponin normal x2, and D-dimer elevated to 5.45.  Influenza and Covid PCR are negative.  CTA chest demonstrates acute pulmonary infarction in the right lung, most severe in the right lower lobe where there is an occlusive embolus and probable area of pulmonary hemorrhage.  There are also CT findings suggestive of COPD.  ED physician discussed the case with PCCM and medical admission was advised.  Patient was given multiple doses of analgesic, pharmacy was consulted for IV heparin infusion, and hospitalist asked to admit.  Review of Systems:  All other systems reviewed and apart from HPI, are negative.  Past Medical History:  Diagnosis Date  . Hypertension   . Thyroid disease     Past Surgical History:  Procedure Laterality Date  . WISDOM TOOTH EXTRACTION       reports that he has been smoking. He has been  smoking about 0.50 packs per day. He has never used smokeless tobacco. He reports that he does not drink alcohol or use drugs.  Allergies  Allergen Reactions  . Lisinopril     Headaches     Family History  Problem Relation Age of Onset  . Diabetes Mother   . Hypertension Mother   . Hypertension Father   . Diabetes Father      Prior to Admission medications   Medication Sig Start Date End Date Taking? Authorizing Provider  amLODipine (NORVASC) 5 MG tablet Take 1 tablet (5 mg total) by mouth daily. 06/09/19 12/07/19 Yes Claiborne Rigg, NP  hydrOXYzine (ATARAX/VISTARIL) 25 MG tablet Take 1-2 tablets (25-50 mg total) by mouth every 6 (six) hours as needed. Patient not taking: Reported on 11/27/2018 06/16/18   Gilda Crease, MD  levothyroxine (SYNTHROID) 100 MCG tablet Take 1 tablet (100 mcg total) by mouth daily. 04/04/19 07/03/19  Claiborne Rigg, NP    Physical Exam: Vitals:   12/07/19 0200 12/07/19 0300 12/07/19 0400 12/07/19 0535  BP: 128/77 (!) 146/88 (!) 142/90 140/89  Pulse: 71 81 84 94  Resp: 15 16 15 12   Temp:      TempSrc:      SpO2: 95% 91% 96% 97%  Weight:      Height:        Constitutional: NAD, calm  Eyes: PERTLA, lids and conjunctivae normal ENMT: Mucous membranes are moist. Posterior pharynx clear of any exudate or lesions.  Neck: normal, supple, no masses, no thyromegaly Respiratory: no wheezing, no crackles. No accessory muscle use.  Cardiovascular: S1 & S2 heard, regular rate and rhythm. No extremity edema.  Abdomen: No distension, no tenderness, soft. Bowel sounds active.  Musculoskeletal: no clubbing / cyanosis. No joint deformity upper and lower extremities.   Skin: no significant rashes, lesions, ulcers. Warm, dry, well-perfused. Neurologic: No facial asymmetry. Sensation intact. Moving all extremities.  Psychiatric: Alert and oriented to person, place, and situation. Calm and cooperative.    Labs and Imaging on Admission: I have  personally reviewed following labs and imaging studies  CBC: Recent Labs  Lab 12/02/19 0304 12/06/19 2222  WBC 9.6 10.5  HGB 13.6 13.0  HCT 41.8 39.4  MCV 92.1 92.3  PLT 224 960   Basic Metabolic Panel: Recent Labs  Lab 12/02/19 0304 12/06/19 2222  NA 139 136  K 3.3* 4.0  CL 101 98  CO2 27 28  GLUCOSE 111* 124*  BUN 16 12  CREATININE 1.01 0.99  CALCIUM 9.2 9.1   GFR: Estimated Creatinine Clearance: 148.6 mL/min (by C-G formula based on SCr of 0.99 mg/dL). Liver Function Tests: No results for input(s): AST, ALT, ALKPHOS, BILITOT, PROT, ALBUMIN in the last 168 hours. No results for input(s): LIPASE, AMYLASE in the last 168 hours. No results for input(s): AMMONIA in the last 168 hours. Coagulation Profile: No results for input(s): INR, PROTIME in the last 168 hours. Cardiac Enzymes: No results for input(s): CKTOTAL, CKMB, CKMBINDEX, TROPONINI in the last 168 hours. BNP (last 3 results) No results for input(s): PROBNP in the last 8760 hours. HbA1C: No results for input(s): HGBA1C in the last 72 hours. CBG: No results for input(s): GLUCAP in the last 168 hours. Lipid Profile: No results for input(s): CHOL, HDL, LDLCALC, TRIG, CHOLHDL, LDLDIRECT in the last 72 hours. Thyroid Function Tests: No results for input(s): TSH, T4TOTAL, FREET4, T3FREE, THYROIDAB in the last 72 hours. Anemia Panel: No results for input(s): VITAMINB12, FOLATE, FERRITIN, TIBC, IRON, RETICCTPCT in the last 72 hours. Urine analysis: No results found for: COLORURINE, APPEARANCEUR, LABSPEC, PHURINE, GLUCOSEU, HGBUR, BILIRUBINUR, KETONESUR, PROTEINUR, UROBILINOGEN, NITRITE, LEUKOCYTESUR Sepsis Labs: @LABRCNTIP (procalcitonin:4,lacticidven:4) ) Recent Results (from the past 240 hour(s))  Respiratory Panel by RT PCR (Flu A&B, Covid) - Nasopharyngeal Swab     Status: None   Collection Time: 12/07/19  3:57 AM   Specimen: Nasopharyngeal Swab  Result Value Ref Range Status   SARS Coronavirus 2 by RT PCR  NEGATIVE NEGATIVE Final    Comment: (NOTE) SARS-CoV-2 target nucleic acids are NOT DETECTED. The SARS-CoV-2 RNA is generally detectable in upper respiratoy specimens during the acute phase of infection. The lowest concentration of SARS-CoV-2 viral copies this assay can detect is 131 copies/mL. A negative result does not preclude SARS-Cov-2 infection and should not be used as the sole basis for treatment or other patient management decisions. A negative result may occur with  improper specimen collection/handling, submission of specimen other than nasopharyngeal swab, presence of viral mutation(s) within the areas targeted by this assay, and inadequate number of viral copies (<131 copies/mL). A negative result must be combined with clinical observations, patient history, and epidemiological information. The expected result is Negative. Fact Sheet for Patients:  PinkCheek.be Fact Sheet for Healthcare Providers:  GravelBags.it This test is not yet ap proved or cleared by the [place] FDA and  has been authorized for detection and/or diagnosis of SARS-CoV-2 by FDA under an Emergency Use Authorization (EUA). This EUA will remain  in  effect (meaning this test can be used) for the duration of the COVID-19 declaration under Section 564(b)(1) of the Act, 21 U.S.C. section 360bbb-3(b)(1), unless the authorization is terminated or revoked sooner.    Influenza A by PCR NEGATIVE NEGATIVE Final   Influenza B by PCR NEGATIVE NEGATIVE Final    Comment: (NOTE) The Xpert Xpress SARS-CoV-2/FLU/RSV assay is intended as an aid in  the diagnosis of influenza from Nasopharyngeal swab specimens and  should not be used as a sole basis for treatment. Nasal washings and  aspirates are unacceptable for Xpert Xpress SARS-CoV-2/FLU/RSV  testing. Fact Sheet for Patients: https://www.moore.com/ Fact Sheet for Healthcare  Providers: https://www.young.biz/ This test is not yet approved or cleared by the [place] FDA and  has been authorized for detection and/or diagnosis of SARS-CoV-2 by  FDA under an Emergency Use Authorization (EUA). This EUA will remain  in effect (meaning this test can be used) for the duration of the  Covid-19 declaration under Section 564(b)(1) of the Act, 21  U.S.C. section 360bbb-3(b)(1), unless the authorization is  terminated or revoked. Performed at [facility], [address] [place], [place] [address]      Radiological Exams on Admission: DG Chest 2 View  Result Date: 12/06/2019 CLINICAL DATA:  Chest pain EXAM: CHEST - 2 VIEW COMPARISON:  December 02, 2019 FINDINGS: The heart size and mediastinal contours are within normal limits. There is mild prominence of the central pulmonary vasculature. Streaky atelectasis seen in the right mid lung. No large airspace consolidation or pleural effusion. No acute osseous abnormality. IMPRESSION: Prominence of the central pulmonary vasculature which may be due to mild pulmonary vascular congestion. Streaky atelectasis in the mid right lung. Electronically Signed   By: Jonna Clark M.D.   On: 12/06/2019 22:07   CT Angio Chest PE W and/or Wo Contrast  Result Date: 12/07/2019 CLINICAL DATA:  39 year old male with chest pain for 1 week. Pain is most severe on the right side with radiation to the ribs and back. Most severe when coughing or sneezing. EXAM: CT ANGIOGRAPHY CHEST WITH CONTRAST TECHNIQUE: Multidetector CT imaging of the chest was performed using the standard protocol during bolus administration of intravenous contrast. Multiplanar CT image reconstructions and MIPs were obtained to evaluate the vascular anatomy. CONTRAST:  OMNIPAQUE IOHEXOL 350 MG/ML SOLN COMPARISON:  No priors. FINDINGS: Comment: Today's study is slightly limited by patient respiratory motion. Cardiovascular: Several filling  defects are noted within right sided pulmonary artery branches including both segmental and subsegmental sized branches to the right upper and right lower lobes, compatible with pulmonary embolism. In the right lower lobe there is some occlusive embolus extending to the posterior basal segment (axial image 138 of series 5). Heart size is borderline enlarged. There is no significant pericardial fluid, thickening or pericardial calcification. No atherosclerotic calcifications in the thoracic aorta or the coronary arteries. Mediastinum/Nodes: Multiple borderline enlarged mediastinal lymph nodes. Mildly enlarged right hilar lymph node measuring up to 1.5 cm in short axis. Esophagus is unremarkable in appearance. No axillary lymphadenopathy. Lungs/Pleura: Peripheral airspace consolidation in the posterior aspect of the right lower lobe likely to reflect pulmonary hemorrhage in the setting of pulmonary infarction. There is a small right pleural effusion adjacent to this. Linear scarring or subsegmental atelectasis in the right upper lobe peripherally. Mild diffuse bronchial wall thickening with mild paraseptal emphysema. Upper Abdomen: Unremarkable. Musculoskeletal: There are no aggressive appearing lytic or blastic lesions noted in the visualized portions of the skeleton. Review of  the MIP images confirms the above findings. IMPRESSION: 1. Study is positive for acute pulmonary infarction in the right lung, most severe in the right lower lobe where there is an occlusive embolus and probable area of pulmonary hemorrhage in the posterior aspect of the right lower lobe from pulmonary infarction. 2. Small right pleural effusion lying dependently. 3. Mildly enlarged right hilar lymph node and borderline enlarged mediastinal lymph nodes. These are nonspecific and potentially reactive. 4. Mild diffuse bronchial wall thickening with mild paraseptal emphysema; imaging findings suggestive of underlying COPD. Emphysema  (ICD10-J43.9). Electronically Signed   By: Trudie Reed M.D.   On: 12/07/2019 04:38    EKG: Independently reviewed. Sinus rhythm, T-wave inversions.   Assessment/Plan   1. Acute pulmonary embolism  - Presents with 5 days of pleuritic chest pain and is found to have occlusive PE on right with infarction  - No LE swelling or tenderness and no precipitating factor identified  - HS troponin is normal  - IV heparin is being started in ED  - Check BNP, check echocardiogram, continue anticoagulation and pain-control    2. Current smoker  - Patient smokes ~1/3 ppd  - He was encouraged to quit or continue cutting back and CT findings suggestive of emphysema were discussed  - Nicotine patch provided    DVT prophylaxis: IV heparin  Code Status: Full  Family Communication: Discussed with patient  Disposition Plan:  Patient is from: home  Anticipated d/c is to: Home  Anticipated d/c date is: 12/08/19 Patient currently: Requiring initiation of anticoagulation for occlusive PE and further evaluation with echocardiogram  Consults called: None  Admission status: Observation     Briscoe Deutscher, MD [facility] Pager: See [email or web address]  If 7AM-7PM, please contact the daytime attending [email or web address]  12/07/2019, 5:59 AM

## 2019-12-07 NOTE — Progress Notes (Signed)
ANTICOAGULATION CONSULT NOTE  Pharmacy Consult for Heparin Indication: pulmonary embolus  Allergies  Allergen Reactions  . Lisinopril     Headaches    Patient Measurements: Height: 6\' 2"  (188 cm) Weight: (!) 141.5 kg (312 lb) IBW/kg (Calculated) : 82.2 Heparin Dosing Weight:   Vital Signs: Temp: 98.5 F (36.9 C) (04/25 1530) Temp Source: Oral (04/25 1530) BP: 145/81 (04/25 1530) Pulse Rate: 84 (04/25 1530)  Labs: Recent Labs    12/06/19 2222 12/07/19 0103 12/07/19 0622 12/07/19 1407 12/07/19 1934  HGB 13.0  --   --   --   --   HCT 39.4  --   --   --   --   PLT 235  --   --   --   --   APTT  --   --  33  --   --   LABPROT  --   --  14.3  --   --   INR  --   --  1.1  --   --   HEPARINUNFRC  --   --   --  0.36 0.25*  CREATININE 0.99  --   --   --   --   TROPONINIHS 4 3  --   --   --    Estimated Creatinine Clearance: 148.6 mL/min (by C-G formula based on SCr of 0.99 mg/dL).  Medical History: Past Medical History:  Diagnosis Date  . Hypertension   . Thyroid disease    Medications:  Infusions:  . sodium chloride    . heparin Stopped (12/07/19 1750)   Assessment: 38 yoM with PMH HTN, tobacco use, presents with several days of R-sided chest pain. CT reveals numerous right-sided pulmonary emboli, associated pulmonary infarct and hemorrhage. Per intensivist, start heparin per pharmacy; higher risk for bleeding but benefits outweigh risks.   Baseline INR, aPTT: WNL  Prior anticoagulation: none  Significant events:  Today, 12/07/2019:  CBC: WNL on admission  1st Heparin level (1407)  Therapeutic(0.36) on 2100 units/hr  No bleeding or infusion issues per nursing  1934 Heparin level 0.25, below therapeutic range  Goal of Therapy: Heparin level 0.3-0.7 units/ml Monitor platelets by anticoagulation protocol: Yes  Plan:  Continue heparin IV infusion at 2100 units/hr  Recheck confirmatory heparin level in 6 hrs  Daily CBC, daily heparin level once  stable  Monitor for signs of bleeding or thrombosis  2101 PharmD 12/07/2019, 8:50 PM

## 2019-12-07 NOTE — Progress Notes (Signed)
ANTICOAGULATION CONSULT NOTE  Pharmacy Consult for Heparin Indication: pulmonary embolus  Allergies  Allergen Reactions  . Lisinopril     Headaches     Patient Measurements: Height: 6\' 2"  (188 cm) Weight: (!) 141.5 kg (312 lb) IBW/kg (Calculated) : 82.2 Heparin Dosing Weight:   Vital Signs: BP: 166/100 (04/25 1414) Pulse Rate: 81 (04/25 1414)  Labs: Recent Labs    12/06/19 2222 12/07/19 0103 12/07/19 0622 12/07/19 1407  HGB 13.0  --   --   --   HCT 39.4  --   --   --   PLT 235  --   --   --   APTT  --   --  33  --   LABPROT  --   --  14.3  --   INR  --   --  1.1  --   HEPARINUNFRC  --   --   --  0.36  CREATININE 0.99  --   --   --   TROPONINIHS 4 3  --   --     Estimated Creatinine Clearance: 148.6 mL/min (by C-G formula based on SCr of 0.99 mg/dL).   Medical History: Past Medical History:  Diagnosis Date  . Hypertension   . Thyroid disease     Medications:  Infusions:  . heparin 2,100 Units/hr (12/07/19 12/09/19)    Assessment: 38 yoM with PMH HTN, tobacco use, presents with several days of R-sided chest pain. CT reveals numerous right-sided pulmonary emboli, associated pulmonary infarct and hemorrhage. Per intensivist, start heparin per pharmacy; higher risk for bleeding but benefits outweigh risks.   Baseline INR, aPTT: WNL  Prior anticoagulation: none  Significant events:  Today, 12/07/2019:  CBC: WNL on admission  Most recent heparin level therapeutic on 2100 units/hr  No bleeding or infusion issues per nursing  Goal of Therapy: Heparin level 0.3-0.7 units/ml Monitor platelets by anticoagulation protocol: Yes  Plan:  Continue heparin IV infusion at 2100 units/hr  Recheck confirmatory heparin level in 6 hrs  Daily CBC, daily heparin level once stable  Monitor for signs of bleeding or thrombosis  2101, PharmD, BCPS [phone] 12/07/2019, 3:00 PM

## 2019-12-07 NOTE — Progress Notes (Signed)
[facility]                                                                              Patient Demographics  Race Glenn Conrad, is a 39 y.o. male, DOB - 14-Mar-1981, ZSW:[id number]  Admit date - 12/07/2019   Admitting Physician Briscoe Deutscher, MD  Outpatient Primary MD for the patient is Claiborne Rigg, NP  Outpatient specialists:   LOS - 0  days   Medical records reviewed and are as summarized below:    Chief Complaint  Patient presents with  . Chest Pain       Brief summary   Patient is a 39 year old male with history of hypertension, tobacco use presented to ED with several days of chest pain.  Patient reported almost a week of chest pain on the right side, radiating towards the back and worse with deep inspiration, cough or sneezing.  No significant shortness of breath.  No prior history of DVT or PE and not aware of any family history of clotting disorders.  Smokes 2 packs/week but cutting back.  No history of COPD. Covid PCR, influenza negative. CTA chest showed acute PE in the right lung most severe in the right lower lobe.  There is an occlusive thromboembolus and probable area of pulmonary hemorrhage. Patient was started on IV heparin drip, EDP did discuss with PCCM and was recommended medical admission   Assessment & Plan    Principal Problem:   Acute right lung pulmonary embolus (HCC) -Unclear etiology, no long distance car travel tolerate flights, no personal history of DVT/PE or family history, no recent trauma or immobility -Patient started on IV heparin drip, will order hypercoagulable panel -Follow venous Dopplers lower extremities, 2D echocardiogram -Patient reports no PCP or insurance, Case management consulted  Active Problems: Essential hypertension -Continue Norvasc  Hypothyroidism -Follow TSH, not taking Synthroid  Obesity Estimated body mass index is 40.06 kg/m as calculated from the following:   Height as of this encounter:  6\' 2"  (1.88 m).   Weight as of this encounter: 141.5 kg.  Code Status: Full CODE STATUS DVT Prophylaxis: Heparin drip Family Communication: Discussed all imaging results, lab results, explained to the patient    Disposition Plan:     Status is: Observation  The patient remains OBS appropriate and will d/c before 2 midnights.  Dispo: The patient is from: Home              Anticipated d/c is to: Home              Anticipated d/c date is: 1 day              Patient currently is not medically stable to d/c.      Time Spent in minutes 35 minutes  Procedures:  CT angiogram chest  Consultants:   EDP discussed with PCCM  Antimicrobials:   Anti-infectives (From admission, onward)   None          Medications  Scheduled Meds: . nicotine  14 mg Transdermal Once   Continuous Infusions: . heparin 2,100 Units/hr (12/07/19 0626)   PRN Meds:.acetaminophen, HYDROcodone-acetaminophen, morphine injection  Subjective:   Glenn Conrad was seen and examined today.  Still complaining of pleuritic chest pain, pain medication not helping no significant shortness of breath right now.  Pain worse with deep breathing or coughing.  Patient denies dizziness, abdominal pain, N/V/D/C, new weakness, numbess, tingling. No acute events overnight.    Objective:   Vitals:   12/07/19 0915 12/07/19 0930 12/07/19 0945 12/07/19 1000  BP:    139/80  Pulse: 92 89 80 79  Resp: (!) 21 (!) 23 17 16   Temp:      TempSrc:      SpO2: 97% 96% 97% 95%  Weight:      Height:       No intake or output data in the 24 hours ending 12/07/19 1123   Wt Readings from Last 3 Encounters:  12/06/19 (!) 141.5 kg  12/02/19 (!) 139.7 kg  06/09/19 (!) 139.7 kg     Exam  General: Alert and oriented x 3, NAD  Cardiovascular: S1 S2 auscultated, no murmurs, RRR  Respiratory: Clear to auscultation bilaterally, no wheezing, rales or rhonchi  Gastrointestinal: Soft, nontender, nondistended, + bowel  sounds  Ext: no pedal edema bilaterally  Neuro: No new deficit  Musculoskeletal: No digital cyanosis, clubbing  Skin: No rashes  Psych: Normal affect and demeanor, alert and oriented x3    Data Reviewed:  I have personally reviewed following labs and imaging studies  Micro Results Recent Results (from the past 240 hour(s))  Respiratory Panel by RT PCR (Flu A&B, Covid) - Nasopharyngeal Swab     Status: None   Collection Time: 12/07/19  3:57 AM   Specimen: Nasopharyngeal Swab  Result Value Ref Range Status   SARS Coronavirus 2 by RT PCR NEGATIVE NEGATIVE Final    Comment: (NOTE) SARS-CoV-2 target nucleic acids are NOT DETECTED. The SARS-CoV-2 RNA is generally detectable in upper respiratoy specimens during the acute phase of infection. The lowest concentration of SARS-CoV-2 viral copies this assay can detect is 131 copies/mL. A negative result does not preclude SARS-Cov-2 infection and should not be used as the sole basis for treatment or other patient management decisions. A negative result may occur with  improper specimen collection/handling, submission of specimen other than nasopharyngeal swab, presence of viral mutation(s) within the areas targeted by this assay, and inadequate number of viral copies (<131 copies/mL). A negative result must be combined with clinical observations, patient history, and epidemiological information. The expected result is Negative. Fact Sheet for Patients:  12/09/19 Fact Sheet for Healthcare Providers:  https://www.moore.com/ This test is not yet ap proved or cleared by the https://www.young.biz/ FDA and  has been authorized for detection and/or diagnosis of SARS-CoV-2 by FDA under an Emergency Use Authorization (EUA). This EUA will remain  in effect (meaning this test can be used) for the duration of the COVID-19 declaration under Section 564(b)(1) of the Act, 21 U.S.C. section 360bbb-3(b)(1),  unless the authorization is terminated or revoked sooner.    Influenza A by PCR NEGATIVE NEGATIVE Final   Influenza B by PCR NEGATIVE NEGATIVE Final    Comment: (NOTE) The Xpert Xpress SARS-CoV-2/FLU/RSV assay is intended as an aid in  the diagnosis of influenza from Nasopharyngeal swab specimens and  should not be used as a sole basis for treatment. Nasal washings and  aspirates are unacceptable for Xpert Xpress SARS-CoV-2/FLU/RSV  testing. Fact Sheet for Patients: [place] Fact Sheet for Healthcare Providers: https://www.moore.com/ This test is not yet approved or cleared by the https://www.young.biz/ and  has been authorized for detection and/or diagnosis of SARS-CoV-2 by  FDA under an Emergency Use Authorization (EUA). This EUA will remain  in effect (meaning this test can be used) for the duration of the  Covid-19 declaration under Section 564(b)(1) of the Act, 21  U.S.C. section 360bbb-3(b)(1), unless the authorization is  terminated or revoked. Performed at [facility], [address] [place], [place] [address]     Radiology Reports DG Chest 2 View  Result Date: 12/06/2019 CLINICAL DATA:  Chest pain EXAM: CHEST - 2 VIEW COMPARISON:  December 02, 2019 FINDINGS: The heart size and mediastinal contours are within normal limits. There is mild prominence of the central pulmonary vasculature. Streaky atelectasis seen in the right mid lung. No large airspace consolidation or pleural effusion. No acute osseous abnormality. IMPRESSION: Prominence of the central pulmonary vasculature which may be due to mild pulmonary vascular congestion. Streaky atelectasis in the mid right lung. Electronically Signed   By: Jonna Clark M.D.   On: 12/06/2019 22:07   DG Chest 2 View  Result Date: 12/02/2019 CLINICAL DATA:  Chest pain and shortness of breath. Symptom onset yesterday. EXAM: CHEST - 2 VIEW COMPARISON:  None. FINDINGS: Lung  volumes are low. Normal heart size and mediastinal contours for technique. Bronchovascular crowding versus vascular congestion. No significant pleural fluid. No pneumothorax. No acute osseous abnormalities are seen. IMPRESSION: Low lung volumes with bronchovascular crowding versus vascular congestion. Electronically Signed   By: Narda Rutherford M.D.   On: 12/02/2019 03:18   CT Angio Chest PE W and/or Wo Contrast  Result Date: 12/07/2019 CLINICAL DATA:  39 year old male with chest pain for 1 week. Pain is most severe on the right side with radiation to the ribs and back. Most severe when coughing or sneezing. EXAM: CT ANGIOGRAPHY CHEST WITH CONTRAST TECHNIQUE: Multidetector CT imaging of the chest was performed using the standard protocol during bolus administration of intravenous contrast. Multiplanar CT image reconstructions and MIPs were obtained to evaluate the vascular anatomy. CONTRAST:  OMNIPAQUE IOHEXOL 350 MG/ML SOLN COMPARISON:  No priors. FINDINGS: Comment: Today's study is slightly limited by patient respiratory motion. Cardiovascular: Several filling defects are noted within right sided pulmonary artery branches including both segmental and subsegmental sized branches to the right upper and right lower lobes, compatible with pulmonary embolism. In the right lower lobe there is some occlusive embolus extending to the posterior basal segment (axial image 138 of series 5). Heart size is borderline enlarged. There is no significant pericardial fluid, thickening or pericardial calcification. No atherosclerotic calcifications in the thoracic aorta or the coronary arteries. Mediastinum/Nodes: Multiple borderline enlarged mediastinal lymph nodes. Mildly enlarged right hilar lymph node measuring up to 1.5 cm in short axis. Esophagus is unremarkable in appearance. No axillary lymphadenopathy. Lungs/Pleura: Peripheral airspace consolidation in the posterior aspect of the right lower lobe likely to  reflect pulmonary hemorrhage in the setting of pulmonary infarction. There is a small right pleural effusion adjacent to this. Linear scarring or subsegmental atelectasis in the right upper lobe peripherally. Mild diffuse bronchial wall thickening with mild paraseptal emphysema. Upper Abdomen: Unremarkable. Musculoskeletal: There are no aggressive appearing lytic or blastic lesions noted in the visualized portions of the skeleton. Review of the MIP images confirms the above findings. IMPRESSION: 1. Study is positive for acute pulmonary infarction in the right lung, most severe in the right lower lobe where there is an occlusive embolus and probable area of pulmonary hemorrhage in the posterior aspect of the right  lower lobe from pulmonary infarction. 2. Small right pleural effusion lying dependently. 3. Mildly enlarged right hilar lymph node and borderline enlarged mediastinal lymph nodes. These are nonspecific and potentially reactive. 4. Mild diffuse bronchial wall thickening with mild paraseptal emphysema; imaging findings suggestive of underlying COPD. Emphysema (ICD10-J43.9). Electronically Signed   By: Vinnie Langton M.D.   On: 12/07/2019 04:38    Lab Data:  CBC: Recent Labs  Lab 12/02/19 0304 12/06/19 2222  WBC 9.6 10.5  HGB 13.6 13.0  HCT 41.8 39.4  MCV 92.1 92.3  PLT 224 342   Basic Metabolic Panel: Recent Labs  Lab 12/02/19 0304 12/06/19 2222  NA 139 136  K 3.3* 4.0  CL 101 98  CO2 27 28  GLUCOSE 111* 124*  BUN 16 12  CREATININE 1.01 0.99  CALCIUM 9.2 9.1   GFR: Estimated Creatinine Clearance: 148.6 mL/min (by C-G formula based on SCr of 0.99 mg/dL). Liver Function Tests: No results for input(s): AST, ALT, ALKPHOS, BILITOT, PROT, ALBUMIN in the last 168 hours. No results for input(s): LIPASE, AMYLASE in the last 168 hours. No results for input(s): AMMONIA in the last 168 hours. Coagulation Profile: Recent Labs  Lab 12/07/19 0622  INR 1.1   Cardiac Enzymes: No  results for input(s): CKTOTAL, CKMB, CKMBINDEX, TROPONINI in the last 168 hours. BNP (last 3 results) No results for input(s): PROBNP in the last 8760 hours. HbA1C: No results for input(s): HGBA1C in the last 72 hours. CBG: No results for input(s): GLUCAP in the last 168 hours. Lipid Profile: No results for input(s): CHOL, HDL, LDLCALC, TRIG, CHOLHDL, LDLDIRECT in the last 72 hours. Thyroid Function Tests: No results for input(s): TSH, T4TOTAL, FREET4, T3FREE, THYROIDAB in the last 72 hours. Anemia Panel: No results for input(s): VITAMINB12, FOLATE, FERRITIN, TIBC, IRON, RETICCTPCT in the last 72 hours. Urine analysis: No results found for: COLORURINE, APPEARANCEUR, LABSPEC, PHURINE, GLUCOSEU, HGBUR, BILIRUBINUR, KETONESUR, PROTEINUR, UROBILINOGEN, NITRITE, LEUKOCYTESUR   Parys Elenbaas M.D. [facility] 12/07/2019, 11:23 AM   Call night coverage person covering after 7pm

## 2019-12-07 NOTE — Progress Notes (Signed)
Attempted lower extremity venous duplex, however patient is agitated stating he needs pain medicine. Informed RN of patient need and will attempt again as schedule permits.  12/07/2019 2:03 PM Eula Fried., MHA, RVT, RDCS, RDMS

## 2019-12-08 ENCOUNTER — Encounter (HOSPITAL_COMMUNITY): Payer: Self-pay

## 2019-12-08 ENCOUNTER — Inpatient Hospital Stay (HOSPITAL_COMMUNITY)
Admit: 2019-12-08 | Discharge: 2019-12-08 | Disposition: A | Discharge status: Home / Self Care | Payer: Self-pay | Attending: Internal Medicine | Admitting: Internal Medicine

## 2019-12-08 DIAGNOSIS — I2699 Other pulmonary embolism without acute cor pulmonale: Secondary | ICD-10-CM

## 2019-12-08 LAB — BASIC METABOLIC PANEL
Anion gap: 13 (ref 5–15)
BUN: 11 mg/dL (ref 6–20)
CO2: 27 mmol/L (ref 22–32)
Calcium: 9.5 mg/dL (ref 8.9–10.3)
Chloride: 99 mmol/L (ref 98–111)
Creatinine, Ser: 0.95 mg/dL (ref 0.61–1.24)
GFR calc Af Amer: >60 mL/min (ref >60)
GFR calc non Af Amer: >60 mL/min (ref >60)
Glucose, Bld: 107 mg/dL — ABNORMAL HIGH (ref 70–99)
Potassium: 3.9 mmol/L (ref 3.5–5.1)
Sodium: 139 mmol/L (ref 135–145)

## 2019-12-08 LAB — CBC
HCT: 38.6 % — ABNORMAL LOW (ref 39.0–52.0)
Hemoglobin: 12.4 g/dL — ABNORMAL LOW (ref 13.0–17.0)
MCH: 29.9 pg (ref 26.0–34.0)
MCHC: 32.1 g/dL (ref 30.0–36.0)
MCV: 93 fL (ref 80.0–100.0)
Platelets: 248 10*3/uL (ref 150–400)
RBC: 4.15 MIL/uL — ABNORMAL LOW (ref 4.22–5.81)
RDW: 12.3 % (ref 11.5–15.5)
WBC: 10.2 10*3/uL (ref 4.0–10.5)
nRBC: 0 % (ref 0.0–0.2)

## 2019-12-08 LAB — HIV ANTIBODY (ROUTINE TESTING W REFLEX): HIV Screen 4th Generation wRfx: NONREACTIVE

## 2019-12-08 LAB — HEPARIN LEVEL (UNFRACTIONATED): Heparin Unfractionated: 0.33 IU/mL (ref 0.30–0.70)

## 2019-12-08 LAB — ANTITHROMBIN III: AntiThromb III Func: 80 % (ref 75–120)

## 2019-12-08 MED ORDER — RIVAROXABAN 15 MG PO TABS
15.0000 mg | ORAL_TABLET | Freq: Two times a day (BID) | ORAL | Status: DC
  Administered 2019-12-08: 15 mg via ORAL
  Filled 2019-12-08 (×2): qty 1

## 2019-12-08 MED ORDER — RIVAROXABAN 20 MG PO TABS: 20.0000 mg | ORAL_TABLET | Freq: Every day | ORAL | Status: DC

## 2019-12-08 MED ORDER — RIVAROXABAN (XARELTO) VTE STARTER PACK (15 & 20 MG)
ORAL_TABLET | ORAL | 0 refills | Status: DC
Start: 2019-12-08 — End: 2020-01-14

## 2019-12-08 MED ORDER — AMLODIPINE BESYLATE 5 MG PO TABS
5.0000 mg | ORAL_TABLET | Freq: Every day | ORAL | Status: DC
  Administered 2019-12-08: 12:00:00 5 mg via ORAL
  Filled 2019-12-08: qty 1

## 2019-12-08 MED ORDER — AMLODIPINE BESYLATE 5 MG PO TABS: 5.0000 mg | ORAL_TABLET | Freq: Every day | ORAL | 3 refills | Status: DC

## 2019-12-08 MED ORDER — RIVAROXABAN 20 MG PO TABS: 20.0000 mg | ORAL_TABLET | Freq: Every day | ORAL | 3 refills | Status: DC

## 2019-12-08 MED FILL — AMLODIPINE BESYLATE 5 MG TA: 5 | 30 days supply | Qty: 30 | Fill #0

## 2019-12-08 MED FILL — XARELTO STARTER PACK: 15 & 20 | 30 days supply | Qty: 51 | Fill #0

## 2019-12-08 NOTE — Progress Notes (Signed)
ANTICOAGULATION CONSULT NOTE - Follow Up Consult  Pharmacy Consult for heparin --> xarelto Indication: acute pulmonary embolus and DVT  Allergies  Allergen Reactions  . Lisinopril     Headaches     Patient Measurements: Height: 6\' 2"  (188 cm) Weight: (!) 141.5 kg (312 lb) IBW/kg (Calculated) : 82.2 Heparin Dosing Weight:   Vital Signs: Temp: 98.1 F (36.7 C) (04/26 0427) Temp Source: Oral (04/26 0427) BP: 141/86 (04/26 0427) Pulse Rate: 83 (04/26 0427)  Labs: Recent Labs    12/06/19 2222 12/07/19 0103 12/07/19 0622 12/07/19 1407 12/07/19 1934 12/08/19 0502  HGB 13.0  --   --   --   --  12.4*  HCT 39.4  --   --   --   --  38.6*  PLT 235  --   --   --   --  248  APTT  --   --  33  --   --   --   LABPROT  --   --  14.3  --   --   --   INR  --   --  1.1  --   --   --   HEPARINUNFRC  --   --   --  0.36 0.25* 0.33  CREATININE 0.99  --   --   --   --  0.95  TROPONINIHS 4 3  --   --   --   --     Estimated Creatinine Clearance: 154.8 mL/min (by C-G formula based on SCr of 0.95 mg/dL).   Assessment: Patient's a 39 y.o M presented to the ED on 4/24 with c/o CP.  Chest CTA showed acute RLL PE and "probable area of pulmonary hemorrhage in the posterior aspect of the right lower lobe from pulmonary infarction."  LE doppler on 4/26 showed acute RLL DVT.  He was started on heparin on admission. Pharmacy was consulted on 4/26 to transition patient to xarelto.   Plan:  - d/c heparin drip - start xarelto 15 mg bid x21 days, then 20 mg daily - monitor for s/sx bleeding - f/u hypercoag. panel  Damione Robideau P 12/08/2019,10:08 AM

## 2019-12-08 NOTE — Plan of Care (Signed)
  Problem: Clinical Measurements: Goal: Diagnostic test results will improve Outcome: Progressing   Problem: Pain Managment: Goal: General experience of comfort will improve Outcome: Progressing   Problem: Safety: Goal: Ability to remain free from injury will improve Outcome: Progressing   

## 2019-12-08 NOTE — Progress Notes (Signed)
Notified Dr. Isidoro Donning about doppler study done this am.  Patient ok to be discharged.

## 2019-12-08 NOTE — Discharge Instructions (Signed)
Information on my medicine - XARELTO (rivaroxaban)  This medication education was reviewed with me or my healthcare representative as part of my discharge preparation.   WHY WAS XARELTO PRESCRIBED FOR YOU? Xarelto was prescribed to treat blood clots that may have been found in the veins of your legs (deep vein thrombosis) or in your lungs (pulmonary embolism) and to reduce the risk of them occurring again.  What do you need to know about Xarelto? The starting dose is one 15 mg tablet taken TWICE daily with food for the FIRST 21 DAYS then on 12/29/19  the dose is changed to one 20 mg tablet taken ONCE A DAY with your evening meal.  DO NOT stop taking Xarelto without talking to the health care provider who prescribed the medication.  Refill your prescription for 20 mg tablets before you run out.  After discharge, you should have regular check-up appointments with your healthcare provider that is prescribing your Xarelto.  In the future your dose may need to be changed if your kidney function changes by a significant amount.  What do you do if you miss a dose? If you are taking Xarelto TWICE DAILY and you miss a dose, take it as soon as you remember. You may take two 15 mg tablets (total 30 mg) at the same time then resume your regularly scheduled 15 mg twice daily the next day.  If you are taking Xarelto ONCE DAILY and you miss a dose, take it as soon as you remember on the same day then continue your regularly scheduled once daily regimen the next day. Do not take two doses of Xarelto at the same time.   Important Safety Information Xarelto is a blood thinner medicine that can cause bleeding. You should call your healthcare provider right away if you experience any of the following: ? Bleeding from an injury or your nose that does not stop. ? Unusual colored urine (red or dark brown) or unusual colored stools (red or black). ? Unusual bruising for unknown reasons. ? A serious fall or  if you hit your head (even if there is no bleeding).  Some medicines may interact with Xarelto and might increase your risk of bleeding while on Xarelto. To help avoid this, consult your healthcare provider or pharmacist prior to using any new prescription or non-prescription medications, including herbals, vitamins, non-steroidal anti-inflammatory drugs (NSAIDs) and supplements.  This website has more information on Xarelto: [email or web address].br.

## 2019-12-08 NOTE — Progress Notes (Signed)
ANTICOAGULATION CONSULT NOTE - Follow Up Consult  Pharmacy Consult for Heparin Indication: pulmonary embolus  Allergies  Allergen Reactions  . Lisinopril     Headaches     Patient Measurements: Height: 6\' 2"  (188 cm) Weight: (!) 141.5 kg (312 lb) IBW/kg (Calculated) : 82.2 Heparin Dosing Weight:   Vital Signs: Temp: 98.1 F (36.7 C) (04/26 0427) Temp Source: Oral (04/26 0427) BP: 141/86 (04/26 0427) Pulse Rate: 83 (04/26 0427)  Labs: Recent Labs    12/06/19 2222 12/07/19 0103 12/07/19 0622 12/07/19 1407 12/07/19 1934 12/08/19 0502  HGB 13.0  --   --   --   --  12.4*  HCT 39.4  --   --   --   --  38.6*  PLT 235  --   --   --   --  248  APTT  --   --  33  --   --   --   LABPROT  --   --  14.3  --   --   --   INR  --   --  1.1  --   --   --   HEPARINUNFRC  --   --   --  0.36 0.25* 0.33  CREATININE 0.99  --   --   --   --  0.95  TROPONINIHS 4 3  --   --   --   --     Estimated Creatinine Clearance: 154.8 mL/min (by C-G formula based on SCr of 0.95 mg/dL).   Medications:  Infusions:  . sodium chloride    . heparin 2,400 Units/hr (12/08/19 0424)    Assessment: Patient with heparin level at goal.  No heparin issues per RN.  Goal of Therapy:  Heparin level 0.3-0.7 units/ml Monitor platelets by anticoagulation protocol: Yes   Plan:  Continue heparin drip at current rate Recheck level at [address], [place] 12/08/2019,6:32 AM

## 2019-12-08 NOTE — Discharge Summary (Signed)
Physician Discharge Summary   Patient ID: Glenn Conrad MRN: [id number] DOB/AGE: June 30, 1981 39 y.o.  Admit date: 12/07/2019 Discharge date: 12/08/2019  Primary Care Physician:  [name], NP   Recommendations for Outpatient Follow-up:  1. Follow up with PCP in 1-2 weeks 2. Started on xarelto, patient will follow at the [facility] for the prescriptions and PCP.  Continue anticoagulation until advised to stop by primary physician 3. Follow hypercoagulable panel  Home Health: None Equipment/Devices:   Discharge Condition: stable  CODE STATUS: FULL Diet recommendation:  Heart healthy diet  Discharge Diagnoses:    . Acute right lung pulmonary embolus (HCC)   Acute DVT involving the right Gastrocnemius . Hypertension Obesity   Consults:  {None    Allergies:   Allergies  Allergen Reactions  . Lisinopril     Headaches      DISCHARGE MEDICATIONS: Allergies as of 12/08/2019      Reactions   Lisinopril    Headaches      Medication List    TAKE these medications   amLODipine 5 MG tablet Commonly known as: NORVASC Take 1 tablet (5 mg total) by mouth daily.   Rivaroxaban Stater Pack (15 mg and 20 mg) Commonly known as: XARELTO STARTER PACK Follow package directions: Take one 15mg  tablet by mouth twice a day. On day 22 (12/29/2019), switch to one 20mg  tablet once a day. Take with food.   rivaroxaban 20 MG Tabs tablet Commonly known as: XARELTO Take 1 tablet (20 mg total) by mouth daily with breakfast. Please start after you have completed the starter pack. Start taking on: Dec 29, 2019        Brief H and P: For complete details please refer to admission H and P, but in brief Patient is a 39 year old male with history of hypertension, tobacco use presented to ED with several days of chest pain.  Patient reported almost a week of chest pain on the right side, radiating towards the back and worse with deep inspiration, cough or  sneezing.  No significant shortness of breath.  No prior history of DVT or PE and not aware of any family history of clotting disorders.  Smokes 2 packs/week but cutting back.  No history of COPD. Covid PCR, influenza negative. CTA chest showed acute PE in the right lung most severe in the right lower lobe.  There is an occlusive thromboembolus and probable area of pulmonary hemorrhage. Patient was started on IV heparin drip, EDP did discuss with PCCM and was recommended medical admission.  Hospital Course:   Acute right lung pulmonary embolus (HCC), acute right lower leg DVT -Unclear etiology, no long distance car travel tolerate flights, no personal history of DVT/PE or family history, no recent trauma or immobility -Patient was started on IV heparin drip.  Discussed risk and benefits of NOACs, now transitioned to Xarelto -Venous Doppler showed acute DVT in the right gastrocnemius vein Showed EF of 60 to 06%, normal diastolic parameters, right ventricular systolic function mildly reduced.  Active Problems: Essential hypertension -Continue Norvasc  Hypothyroidism -Patient has not been taking Synthroid, TSH is 3.5  Obesity Estimated body mass index is 40.06 kg/m as calculated from the following:   Height as of this encounter: 6\' 2"  (1.88 m).   Weight as of this encounter: 141.5 kg. Counseled on diet and weight control  Day of Discharge S: No chest pain, no further shortness of breath, hoping to go home today  BP (!) 141/86 (BP Location:  Right Arm)   Pulse 83   Temp 98.1 F (36.7 C) (Oral)   Resp 18   Ht 6\' 2"  (1.88 m)   Wt (!) 141.5 kg   SpO2 96%   BMI 40.06 kg/m   Physical Exam: General: Alert and awake oriented x3 not in any acute distress. HEENT: anicteric sclera, pupils reactive to light and accommodation CVS: S1-S2 clear no murmur rubs or gallops Chest: clear to auscultation bilaterally, no wheezing rales or rhonchi Abdomen: soft nontender, nondistended, normal  bowel sounds Extremities: no cyanosis, clubbing or edema noted bilaterally Neuro: Cranial nerves II-XII intact, no focal neurological deficits    Get Medicines reviewed and adjusted: Please take all your medications with you for your next visit with your Primary MD  Please request your Primary MD to go over all hospital tests and procedure/radiological results at the follow up. Please ask your Primary MD to get all Hospital records sent to his/her office.  If you experience worsening of your admission symptoms, develop shortness of breath, life threatening emergency, suicidal or homicidal thoughts you must seek medical attention immediately by calling 911 or calling your MD immediately  if symptoms less severe.  You must read complete instructions/literature along with all the possible adverse reactions/side effects for all the Medicines you take and that have been prescribed to you. Take any new Medicines after you have completely understood and accept all the possible adverse reactions/side effects.   Do not drive when taking pain medications.   Do not take more than prescribed Pain, Sleep and Anxiety Medications  Special Instructions: If you have smoked or chewed Tobacco  in the last 2 yrs please stop smoking, stop any regular Alcohol  and or any Recreational drug use.  Wear Seat belts while driving.  Please note  You were cared for by a hospitalist during your hospital stay. Once you are discharged, your primary care physician will handle any further medical issues. Please note that NO REFILLS for any discharge medications will be authorized once you are discharged, as it is imperative that you return to your primary care physician (or establish a relationship with a primary care physician if you do not have one) for your aftercare needs so that they can reassess your need for medications and monitor your lab values.   The results of significant diagnostics from this hospitalization  (including imaging, microbiology, ancillary and laboratory) are listed below for reference.      Procedures/Studies:  DG Chest 2 View  Result Date: 12/06/2019 CLINICAL DATA:  Chest pain EXAM: CHEST - 2 VIEW COMPARISON:  December 02, 2019 FINDINGS: The heart size and mediastinal contours are within normal limits. There is mild prominence of the central pulmonary vasculature. Streaky atelectasis seen in the right mid lung. No large airspace consolidation or pleural effusion. No acute osseous abnormality. IMPRESSION: Prominence of the central pulmonary vasculature which may be due to mild pulmonary vascular congestion. Streaky atelectasis in the mid right lung. Electronically Signed   By: December 04, 2019 M.D.   On: 12/06/2019 22:07   DG Chest 2 View  Result Date: 12/02/2019 CLINICAL DATA:  Chest pain and shortness of breath. Symptom onset yesterday. EXAM: CHEST - 2 VIEW COMPARISON:  None. FINDINGS: Lung volumes are low. Normal heart size and mediastinal contours for technique. Bronchovascular crowding versus vascular congestion. No significant pleural fluid. No pneumothorax. No acute osseous abnormalities are seen. IMPRESSION: Low lung volumes with bronchovascular crowding versus vascular congestion. Electronically Signed   By: 12/04/2019  M.D.   On: 12/02/2019 03:18   CT Angio Chest PE W and/or Wo Contrast  Result Date: 12/07/2019 CLINICAL DATA:  39 year old male with chest pain for 1 week. Pain is most severe on the right side with radiation to the ribs and back. Most severe when coughing or sneezing. EXAM: CT ANGIOGRAPHY CHEST WITH CONTRAST TECHNIQUE: Multidetector CT imaging of the chest was performed using the standard protocol during bolus administration of intravenous contrast. Multiplanar CT image reconstructions and MIPs were obtained to evaluate the vascular anatomy. CONTRAST:  OMNIPAQUE IOHEXOL 350 MG/ML SOLN COMPARISON:  No priors. FINDINGS: Comment: Today's study is slightly limited by  patient respiratory motion. Cardiovascular: Several filling defects are noted within right sided pulmonary artery branches including both segmental and subsegmental sized branches to the right upper and right lower lobes, compatible with pulmonary embolism. In the right lower lobe there is some occlusive embolus extending to the posterior basal segment (axial image 138 of series 5). Heart size is borderline enlarged. There is no significant pericardial fluid, thickening or pericardial calcification. No atherosclerotic calcifications in the thoracic aorta or the coronary arteries. Mediastinum/Nodes: Multiple borderline enlarged mediastinal lymph nodes. Mildly enlarged right hilar lymph node measuring up to 1.5 cm in short axis. Esophagus is unremarkable in appearance. No axillary lymphadenopathy. Lungs/Pleura: Peripheral airspace consolidation in the posterior aspect of the right lower lobe likely to reflect pulmonary hemorrhage in the setting of pulmonary infarction. There is a small right pleural effusion adjacent to this. Linear scarring or subsegmental atelectasis in the right upper lobe peripherally. Mild diffuse bronchial wall thickening with mild paraseptal emphysema. Upper Abdomen: Unremarkable. Musculoskeletal: There are no aggressive appearing lytic or blastic lesions noted in the visualized portions of the skeleton. Review of the MIP images confirms the above findings. IMPRESSION: 1. Study is positive for acute pulmonary infarction in the right lung, most severe in the right lower lobe where there is an occlusive embolus and probable area of pulmonary hemorrhage in the posterior aspect of the right lower lobe from pulmonary infarction. 2. Small right pleural effusion lying dependently. 3. Mildly enlarged right hilar lymph node and borderline enlarged mediastinal lymph nodes. These are nonspecific and potentially reactive. 4. Mild diffuse bronchial wall thickening with mild paraseptal emphysema; imaging  findings suggestive of underlying COPD. Emphysema (ICD10-J43.9). Electronically Signed   By: Trudie Reed M.D.   On: 12/07/2019 04:38   ECHOCARDIOGRAM COMPLETE  Result Date: 12/07/2019    ECHOCARDIOGRAM REPORT   Patient Name:   OLUWADAMILOLA ROSAMOND Date of Exam: 12/07/2019 Medical Rec #:  [id number]      Height:       74.0 in Accession #:    [id number]     Weight:       312.0 lb Date of Birth:  [birth year]      BSA:          2.626 m Patient Age:    38 years       BP:           138/81 mmHg Patient Gender: M              HR:           80 bpm. Exam Location:  Inpatient Procedure: 2D Echo Indications:    pulmonary embolus  History:        Patient has no prior history of Echocardiogram examinations.                 Risk Factors:Current Smoker and Hypertension.  Sonographer:    Celene Skeen RDCS (AE) Referring Phys: [id number] TIMOTHY S OPYD  Sonographer Comments: limited mobility. off axis apical windows IMPRESSIONS  1. Left ventricular ejection fraction, by estimation, is 60 to 65%. The left ventricle has normal function. The left ventricle has no regional wall motion abnormalities. There is moderate left ventricular hypertrophy. Left ventricular diastolic parameters were normal.  2. Right ventricular systolic function is mildly reduced. The right ventricular size is mildly enlarged.  3. The mitral valve is normal in structure. No evidence of mitral valve regurgitation. No evidence of mitral stenosis.  4. The aortic valve is normal in structure. Aortic valve regurgitation is not visualized. No aortic stenosis is present.  5. The inferior vena cava is normal in size with greater than 50% respiratory variability, suggesting right atrial pressure of 3 mmHg. FINDINGS  Left Ventricle: Left ventricular ejection fraction, by estimation, is 60 to 65%. The left ventricle has normal function. The left ventricle has no regional wall motion abnormalities. The left ventricular internal cavity size was normal in size. There is  moderate  left ventricular hypertrophy. Left ventricular diastolic parameters were normal. Right Ventricle: The right ventricular size is mildly enlarged. No increase in right ventricular wall thickness. Right ventricular systolic function is mildly reduced. Left Atrium: Left atrial size was normal in size. Right Atrium: Right atrial size was normal in size. Pericardium: There is no evidence of pericardial effusion. Mitral Valve: The mitral valve is normal in structure. Normal mobility of the mitral valve leaflets. No evidence of mitral valve regurgitation. No evidence of mitral valve stenosis. Tricuspid Valve: The tricuspid valve is normal in structure. Tricuspid valve regurgitation is not demonstrated. No evidence of tricuspid stenosis. Aortic Valve: The aortic valve is normal in structure. Aortic valve regurgitation is not visualized. No aortic stenosis is present. Pulmonic Valve: The pulmonic valve was normal in structure. Pulmonic valve regurgitation is not visualized. No evidence of pulmonic stenosis. Aorta: The aortic root is normal in size and structure. Venous: The inferior vena cava is normal in size with greater than 50% respiratory variability, suggesting right atrial pressure of 3 mmHg. IAS/Shunts: No atrial level shunt detected by color flow Doppler.  LEFT VENTRICLE PLAX 2D LVIDd:         4.70 cm  Diastology LVIDs:         3.10 cm  LV e' lateral:   9.14 cm/s LV PW:         1.50 cm  LV E/e' lateral: 5.9 LV IVS:        1.40 cm  LV e' medial:    8.16 cm/s LVOT diam:     2.50 cm  LV E/e' medial:  6.6 LV SV:         46 LV SV Index:   18 LVOT Area:     4.91 cm  RIGHT VENTRICLE RV S prime:     14.10 cm/s TAPSE (M-mode): 1.9 cm LEFT ATRIUM           Index       RIGHT ATRIUM           Index LA diam:      4.40 cm 1.68 cm/m  RA Area:     16.10 cm LA Vol (A4C): 50.5 ml 19.23 ml/m RA Volume:   37.60 ml  14.32 ml/m  AORTIC VALVE LVOT Vmax:   70.50 cm/s LVOT Vmean:  53.200 cm/s LVOT VTI:    0.094 m  AORTA Ao Root diam:  3.40 cm  MITRAL VALVE MV Area (PHT): 2.62 cm    SHUNTS MV Decel Time: 290 msec    Systemic VTI:  0.09 m MV E velocity: 54.00 cm/s  Systemic Diam: 2.50 cm MV A velocity: 49.70 cm/s MV E/A ratio:  1.09 Donato Schultz MD Electronically signed by Donato Schultz MD Signature Date/Time: 12/07/2019/2:15:09 PM    Final    VAS [place] LOWER EXTREMITY VENOUS (DVT)  Result Date: 12/08/2019  Lower Venous DVTStudy Indications: Pulmonary embolism.  Risk Factors: Confirmed PE. Anticoagulation: Heparin. Limitations: Body habitus and poor ultrasound/tissue interface. Comparison Study: No prior studies. Performing Technologist: Chanda Busing RVT  Examination Guidelines: A complete evaluation includes B-mode imaging, spectral Doppler, color Doppler, and power Doppler as needed of all accessible portions of each vessel. Bilateral testing is considered an integral part of a complete examination. Limited examinations for reoccurring indications may be performed as noted. The reflux portion of the exam is performed with the patient in reverse Trendelenburg.  +---------+---------------+---------+-----------+----------+--------------+ RIGHT    CompressibilityPhasicitySpontaneityPropertiesThrombus Aging +---------+---------------+---------+-----------+----------+--------------+ CFV      Full           Yes      Yes                                 +---------+---------------+---------+-----------+----------+--------------+ SFJ      Full                                                        +---------+---------------+---------+-----------+----------+--------------+ FV Prox  Full                                                        +---------+---------------+---------+-----------+----------+--------------+ FV Mid   Full                                                        +---------+---------------+---------+-----------+----------+--------------+ FV DistalFull                                                         +---------+---------------+---------+-----------+----------+--------------+ PFV      Full                                                        +---------+---------------+---------+-----------+----------+--------------+ POP      Full           Yes      Yes                                 +---------+---------------+---------+-----------+----------+--------------+ PTV  Full                                                        +---------+---------------+---------+-----------+----------+--------------+ PERO                                                  Not visualized +---------+---------------+---------+-----------+----------+--------------+ Gastroc  None                                         Acute          +---------+---------------+---------+-----------+----------+--------------+   +---------+---------------+---------+-----------+----------+--------------+ LEFT     CompressibilityPhasicitySpontaneityPropertiesThrombus Aging +---------+---------------+---------+-----------+----------+--------------+ CFV      Full           Yes      Yes                                 +---------+---------------+---------+-----------+----------+--------------+ SFJ      Full                                                        +---------+---------------+---------+-----------+----------+--------------+ FV Prox  Full                                                        +---------+---------------+---------+-----------+----------+--------------+ FV Mid   Full                                                        +---------+---------------+---------+-----------+----------+--------------+ FV Distal               Yes      Yes                                 +---------+---------------+---------+-----------+----------+--------------+ PFV      Full                                                         +---------+---------------+---------+-----------+----------+--------------+ POP      Full           Yes      Yes                                 +---------+---------------+---------+-----------+----------+--------------+ PTV      Full                                                        +---------+---------------+---------+-----------+----------+--------------+  PERO                                                  Not visualized +---------+---------------+---------+-----------+----------+--------------+     Summary: RIGHT: - Findings consistent with acute deep vein thrombosis involving the right gastrocnemius veins. - No cystic structure found in the popliteal fossa.  LEFT: - There is no evidence of deep vein thrombosis in the lower extremity. However, portions of this examination were limited- see technologist comments above.  - No cystic structure found in the popliteal fossa.  *See table(s) above for measurements and observations.    Preliminary       LAB RESULTS: Basic Metabolic Panel: Recent Labs  Lab 12/06/19 2222 12/08/19 0502  NA 136 139  K 4.0 3.9  CL 98 99  CO2 28 27  GLUCOSE 124* 107*  BUN 12 11  CREATININE 0.99 0.95  CALCIUM 9.1 9.5   Liver Function Tests: No results for input(s): AST, ALT, ALKPHOS, BILITOT, PROT, ALBUMIN in the last 168 hours. No results for input(s): LIPASE, AMYLASE in the last 168 hours. No results for input(s): AMMONIA in the last 168 hours. CBC: Recent Labs  Lab 12/06/19 2222 12/06/19 2222 12/08/19 0502  WBC 10.5  --  10.2  HGB 13.0  --  12.4*  HCT 39.4  --  38.6*  MCV 92.3   < > 93.0  PLT 235  --  248   < > = values in this interval not displayed.   Cardiac Enzymes: No results for input(s): CKTOTAL, CKMB, CKMBINDEX, TROPONINI in the last 168 hours. BNP: Invalid input(s): POCBNP CBG: No results for input(s): GLUCAP in the last 168 hours.     Disposition and Follow-up: Discharge Instructions    Diet - low sodium  heart healthy   Complete by: As directed    Increase activity slowly   Complete by: As directed        DISPOSITION: Home   DISCHARGE FOLLOW-UP Follow-up Information    [facility] Follow up on 01/09/2020.   Specialty: Internal Medicine Why: 9:20a;photo ID, medicines. Contact information: [address] [place] [address] [phone]       [facility] Follow up.   Why: go directly @ discharge for medicines. Contact information: [address] [place] [address] [phone]           Time coordinating discharge:  35 minutes  Signed:   Thad Ranger M.D. [facility] 12/08/2019, 12:12 PM

## 2019-12-08 NOTE — TOC Transition Note (Signed)
Transition of Care [facility]) - CM/SW Discharge Note   Patient Details  Name: Glenn Conrad MRN: [id number] Date of Birth: September 15, 1980  [facility] CM/SW Contact:  Lanier Clam, RN Phone Number: 12/08/2019, 10:14 AM   Clinical Narrative:CM referral for meds asst,& pcp. Email sent to [facility] pharmacy for 1x free fill, & TC spoke to [facility] also for samples. PCP appt set see f/u d/c section. Patient voiced understanding to go directly to [facility] pharmacy @ d/c.Patient has own transport. No further CM needs.      Final next level of care: Home/Self Care Barriers to Discharge: No Barriers Identified   Patient Goals and CMS Choice Patient states their goals for this hospitalization and ongoing recovery are:: go home      Discharge Placement                       Discharge Plan and Services   Discharge Planning Services: CM Consult, Medication Assistance                                 Social Determinants of Health (SDOH) Interventions     Readmission Risk Interventions No flowsheet data found.

## 2019-12-08 NOTE — Progress Notes (Signed)
Bilateral lower extremity venous duplex has been completed. Preliminary results can be found in CV Proc through chart review.  Results were given to the patient's nurse, Amil Amen.  12/08/19 9:53 AM Olen Cordial RVT

## 2019-12-09 ENCOUNTER — Telehealth: Payer: Self-pay

## 2019-12-09 DIAGNOSIS — Z760 Encounter for issue of repeat prescription: Secondary | ICD-10-CM | POA: Insufficient documentation

## 2019-12-09 DIAGNOSIS — R0789 Other chest pain: Secondary | ICD-10-CM | POA: Insufficient documentation

## 2019-12-09 DIAGNOSIS — I10 Essential (primary) hypertension: Secondary | ICD-10-CM | POA: Insufficient documentation

## 2019-12-09 DIAGNOSIS — I2699 Other pulmonary embolism without acute cor pulmonale: Secondary | ICD-10-CM | POA: Insufficient documentation

## 2019-12-09 DIAGNOSIS — F17210 Nicotine dependence, cigarettes, uncomplicated: Secondary | ICD-10-CM | POA: Insufficient documentation

## 2019-12-09 LAB — CARDIOLIPIN ANTIBODIES, IGG, IGM, IGA
Anticardiolipin IgA: <9 APL U/mL (ref 0–11)
Anticardiolipin IgG: <9 GPL U/mL (ref 0–14)
Anticardiolipin IgM: <9 MPL U/mL (ref 0–12)

## 2019-12-09 LAB — BETA-2-GLYCOPROTEIN I ABS, IGG/M/A
Beta-2 Glyco I IgG: <9 GPI IgG units (ref 0–20)
Beta-2-Glycoprotein I IgA: <9 GPI IgA units (ref 0–25)
Beta-2-Glycoprotein I IgM: <9 GPI IgM units (ref 0–32)

## 2019-12-09 LAB — HOMOCYSTEINE: Homocysteine: 9.5 umol/L (ref 0.0–14.5)

## 2019-12-09 LAB — PROTEIN C, TOTAL: Protein C, Total: 118 % (ref 60–150)

## 2019-12-09 NOTE — Telephone Encounter (Signed)
Transition Care Management Follow-up Telephone Call  Date of discharge and from where: 12/08/2019, [facility]   How have you been since you were released from the hospital? He said that he continues to have right sided chest pain that radiates to his shoulder.  This was the pain that he had in the hospital but he stated that they gave him prescription pain medication when he was there. He said that with the medication the pain was " manageable at 8."  He was discharged home without any prescription  pain medication.  He said that he was told to take OTC Tylenol and not to exceed 4000 mg /day.  He said that the tylenol has not helped at all and the pain is now 10+/10, sharp and intermittent. He said that he called the 4th floor he was discharged from regarding the need for pain medication other than tylenol.  He stated that he is waiting for a call back from the nurse,Julie.    Any questions or concerns? Noted above  Items Reviewed:  Did the pt receive and understand the discharge instructions provided?  yes  Medications obtained and verified?  he said that he plans to pick up the xarelto starter pack and amlodipine  today.   Any new allergies since your discharge?  none reported   Do you have support at home?  yes, significant other  Other (ie: DME, Home Health, etc) no home health or DME ordered.   Functional Questionnaire: (I = Independent and D = Dependent) ADL's: independent   Follow up appointments reviewed:    PCP Hospital f/u appt confirmed? .has appointment scheduled at [facility] 01/09/2020.  He was not sure why that was scheduled.  He preferred an appointment with Ms Meredeth Ide, NP and he was scheduled for 12/23/2019 @1050   Specialist Hospital f/u appt confirmed? none scheduled at present time   Are transportation arrangements needed?  no  If their condition worsens, is the pt aware to call  their PCP or go to the ED?yes  Was the patient provided with contact information for  the PCP's office or ED?  he has the clinic phone #   Was the pt encouraged to call back with questions or concerns?  yes

## 2019-12-10 ENCOUNTER — Encounter (HOSPITAL_COMMUNITY): Payer: Self-pay | Admitting: *Deleted

## 2019-12-10 ENCOUNTER — Other Ambulatory Visit: Payer: Self-pay

## 2019-12-10 ENCOUNTER — Emergency Department (HOSPITAL_COMMUNITY)
Admission: EM | Admit: 2019-12-10 | Discharge: 2019-12-10 | Disposition: A | Discharge status: Home / Self Care | Payer: Self-pay | Attending: Emergency Medicine | Admitting: Emergency Medicine

## 2019-12-10 DIAGNOSIS — I2699 Other pulmonary embolism without acute cor pulmonale: Secondary | ICD-10-CM

## 2019-12-10 DIAGNOSIS — R079 Chest pain, unspecified: Secondary | ICD-10-CM

## 2019-12-10 HISTORY — DX: Other pulmonary embolism without acute cor pulmonale: I26.99

## 2019-12-10 LAB — PROTEIN S ACTIVITY: Protein S Activity: 37 % — ABNORMAL LOW (ref 63–140)

## 2019-12-10 LAB — PROTEIN S, TOTAL: Protein S Ag, Total: 91 % (ref 60–150)

## 2019-12-10 LAB — PROTEIN C ACTIVITY: Protein C Activity: 130 % (ref 73–180)

## 2019-12-10 MED ORDER — METHOCARBAMOL 500 MG PO TABS
500.0000 mg | ORAL_TABLET | Freq: Two times a day (BID) | ORAL | 0 refills | Status: DC
Start: 2019-12-10 — End: 2019-12-15

## 2019-12-10 MED ORDER — OXYCODONE HCL 5 MG PO TABS: 5.0000 mg | ORAL_TABLET | ORAL | 0 refills | Status: AC | PRN

## 2019-12-10 MED ORDER — OXYCODONE HCL 5 MG PO CAPS: 5.0000 mg | ORAL_CAPSULE | ORAL | 0 refills | Status: DC | PRN

## 2019-12-10 MED ORDER — OXYCODONE-ACETAMINOPHEN 5-325 MG PO TABS
1.0000 | ORAL_TABLET | Freq: Once | ORAL | Status: DC
  Filled 2019-12-10: qty 1

## 2019-12-10 NOTE — ED Notes (Signed)
Per EDP, patient does not want any interventions at this time, just wants to go home

## 2019-12-10 NOTE — ED Provider Notes (Signed)
[facility]-EMERGENCY DEPT Provider Note   CSN: [id number] Arrival date & time: 12/09/19  2358     History Chief Complaint  Patient presents with  . Medication Refill    Glenn Conrad is a 39 y.o. male.  Presents to emergency department with chief complaint of chest pain.  Patient recently admitted for pulmonary embolism.  States that initially he had severe chest pain as well as difficulty in breathing.  CT scan showed PE, started on anticoagulation admitted to the hospital.  He said while he was in the hospital his breathing improved, his pain somewhat improved but continued to have fair amount of ongoing pain.  States when he was in the hospital he was receiving multiple as needed narcotic medications that was helping to manage his pain.  States he was discharged with instructions not to take NSAIDs and only to take Tylenol.  States that Tylenol alone is not managing his pain.  Pain sharp, stabbing, right-sided, worse with deep breath.  No increased work of breathing, symptoms not worse since discharge.  Compliant with newly prescribed Xarelto.  Additional history obtained via chart review, recent discharge summary, recent CT imaging.  HPI     Past Medical History:  Diagnosis Date  . Hypertension   . PE (pulmonary thromboembolism) (HCC)   . Thyroid disease     Patient Active Problem List   Diagnosis Date Noted  . Acute pulmonary embolus (HCC) 12/07/2019  . Hypertension   . Current smoker     Past Surgical History:  Procedure Laterality Date  . WISDOM TOOTH EXTRACTION         Family History  Problem Relation Age of Onset  . Diabetes Mother   . Hypertension Mother   . Hypertension Father   . Diabetes Father     Social History   Tobacco Use  . Smoking status: Current Every Day Smoker    Packs/day: 0.50  . Smokeless tobacco: Never Used  Substance Use Topics  . Alcohol use: No  . Drug use: No    Home Medications Prior to Admission  medications   Medication Sig Start Date End Date Taking? Authorizing Provider  amLODipine (NORVASC) 5 MG tablet Take 1 tablet (5 mg total) by mouth daily. 12/08/19 04/06/20  Rai, Delene Ruffini, MD  methocarbamol (ROBAXIN) 500 MG tablet Take 1 tablet (500 mg total) by mouth 2 (two) times daily for 10 days. 12/10/19 12/20/19  Milagros Loll, MD  oxycodone (OXY-IR) 5 MG capsule Take 1 capsule (5 mg total) by mouth every 4 (four) hours as needed for up to 3 days. 12/10/19 12/13/19  Milagros Loll, MD  rivaroxaban (XARELTO) 20 MG TABS tablet Take 1 tablet (20 mg total) by mouth daily with breakfast. Please start after you have completed the starter pack. 12/29/19   Rai, Delene Ruffini, MD  RIVAROXABAN Carlena Hurl) VTE STARTER PACK (15 & 20 MG TABLETS) Follow package directions: Take one 15mg  tablet by mouth twice a day. On day 22 (12/29/2019), switch to one 20mg  tablet once a day. Take with food. 12/08/19   Rai, , MD    Allergies    Lisinopril  Review of Systems   Review of Systems  Constitutional: Negative for chills and fever.  HENT: Negative for ear pain and sore throat.   Eyes: Negative for pain and visual disturbance.  Respiratory: Negative for cough and shortness of breath.   Cardiovascular: Positive for chest pain. Negative for palpitations.  Gastrointestinal: Negative for abdominal pain and  vomiting.  Genitourinary: Negative for dysuria and hematuria.  Musculoskeletal: Negative for arthralgias and back pain.  Skin: Negative for color change and rash.  Neurological: Negative for seizures and syncope.  All other systems reviewed and are negative.   Physical Exam Updated Vital Signs BP (!) 121/110 (BP Location: Left Arm)   Pulse 86   Temp 98.3 F (36.8 C)   Resp 18   Ht 6\' 2"  (1.88 m)   Wt (!) 141.5 kg   SpO2 99%   BMI 40.06 kg/m   Physical Exam Vitals and nursing note reviewed.  Constitutional:      Appearance: He is well-developed.  HENT:     Head: Normocephalic and  atraumatic.  Eyes:     Conjunctiva/sclera: Conjunctivae normal.  Cardiovascular:     Rate and Rhythm: Normal rate and regular rhythm.     Heart sounds: No murmur.  Pulmonary:     Effort: Pulmonary effort is normal. No respiratory distress.     Breath sounds: Normal breath sounds.  Abdominal:     Palpations: Abdomen is soft.     Tenderness: There is no abdominal tenderness.  Musculoskeletal:        General: No swelling, deformity or signs of injury.     Cervical back: Neck supple.  Skin:    General: Skin is warm and dry.     Capillary Refill: Capillary refill takes less than 2 seconds.  Neurological:     General: No focal deficit present.     Mental Status: He is alert and oriented to person, place, and time.  Psychiatric:        Mood and Affect: Mood normal.        Behavior: Behavior normal.     ED Results / Procedures / Treatments   Labs (all labs ordered are listed, but only abnormal results are displayed) Labs Reviewed - No data to display  EKG EKG Interpretation  Date/Time:  Wednesday December 10 2019 00:34:58 EDT Ventricular Rate:  80 PR Interval:    QRS Duration: 100 QT Interval:  358 QTC Calculation: 413 R Axis:   33 Text Interpretation: Sinus rhythm Nonspecific T abnormalities, inferior leads 12 Lead; Mason-Likar No acute changes Confirmed by [id number] (Marianna Fuss) on 12/10/2019 9:51:18 AM   Radiology No results found.  Procedures Procedures (including critical care time)  Medications Ordered in ED Medications  oxyCODONE-acetaminophen (PERCOCET/ROXICET) 5-325 MG per tablet 1 tablet (has no administration in time range)    ED Course  I have reviewed the triage vital signs and the nursing notes.  Pertinent labs & imaging results that were available during my care of the patient were reviewed by me and considered in my medical decision making (see chart for details).  Clinical Course as of Dec 09 1029  Wed Dec 10, 2019  Dec 12, 2019 Reviewed chart, recent dc  sum and recent CT scan   [RD]    Clinical Course User Index [RD] 4315, MD   MDM Rules/Calculators/A&P                      39 year old male recent diagnosis right-sided pulmonary embolism discharged yesterday complaining of ongoing chest pain.  Patient denies any worsening of his symptoms, reports marked improvement in breathing.  He has no hypoxia, no tachypnea, no tachycardia.  Clinically very low suspicion for failed Xarelto therapy.  Suspect this is more issue of pain control.  Do not feel he needs repeat CT imaging given current clinical status.  Stressed need for close PCP follow-up, will give Rx short for oxycodone.  Recommended continuing Tylenol and adding Robaxin.  Reviewed return precautions, discharged home.    After the discussed management above, the patient was determined to be safe for discharge.  The patient was in agreement with this plan and all questions regarding their care were answered.  ED return precautions were discussed and the patient will return to the ED with any significant worsening of condition.   Final Clinical Impression(s) / ED Diagnoses Final diagnoses:  Pulmonary embolism, other, unspecified chronicity, unspecified whether acute cor pulmonale present ([place])  Right-sided chest pain    Rx / DC Orders ED Discharge Orders         Ordered    oxycodone (OXY-IR) 5 MG capsule  Every 4 hours PRN     12/10/19 1023    methocarbamol (ROBAXIN) 500 MG tablet  2 times daily     12/10/19 1023           Lucrezia Starch, MD 12/10/19 [phone]

## 2019-12-10 NOTE — Progress Notes (Signed)
TOC CM contacted [facility] for an earlier appt. They currently do not have an earlier appt than his 5/11 appt. Sent message to the [facility], Erskine Squibb to see she can review schedule for an earlier appt. ED Provider updated. Isidoro Donning RN CCM, [facility] ED TOC CM [phone]

## 2019-12-10 NOTE — ED Triage Notes (Signed)
Pt says that he was seen here for PE, d/c yesterday around 1230, says that he is having pain and needs pain medication. C/o right lower back pain that radiates up through the anterior chest and right shoulder, consistent with the pain he was having. Taking tylenol without relief, says he cannot sleep d/t pain. No other complaints

## 2019-12-10 NOTE — Discharge Instructions (Addendum)
Future Appointments  Date Time Provider Department Center  12/23/2019 10:50 AM Claiborne Rigg, NP [facility] None  01/09/2020  9:20 AM Barbette Merino, NP [facility] None   Please call both Ms. Meredeth Ide and Ms. Elmer Bales office to try getting a closer follow-up appointment.  Take the oxycodone as needed for breakthrough pain.  Please continue taking the scheduled Tylenol as well as this newly prescribed Robaxin.  If you develop worsening chest pain, any difficulty in breathing, lightheadedness or passing out, fever, return to ER for reassessment.

## 2019-12-11 LAB — LUPUS ANTICOAGULANT PANEL
DRVVT: 50.8 s — ABNORMAL HIGH (ref 0.0–47.0)
PTT Lupus Anticoagulant: 121.6 s — ABNORMAL HIGH (ref 0.0–51.9)

## 2019-12-11 LAB — DRVVT MIX: dRVVT Mix: 43.1 s — ABNORMAL HIGH (ref 0.0–40.4)

## 2019-12-11 LAB — PTT-LA MIX: PTT-LA Mix: 115.1 s — ABNORMAL HIGH (ref 0.0–48.9)

## 2019-12-11 LAB — PROTHROMBIN GENE MUTATION

## 2019-12-11 LAB — HEXAGONAL PHASE PHOSPHOLIPID: Hexagonal Phase Phospholipid: 0 s (ref 0–11)

## 2019-12-11 LAB — DRVVT CONFIRM: dRVVT Confirm: 1.2 ratio (ref 0.8–1.2)

## 2019-12-15 ENCOUNTER — Encounter: Payer: Self-pay | Admitting: Family Medicine

## 2019-12-15 ENCOUNTER — Other Ambulatory Visit: Payer: Self-pay

## 2019-12-15 ENCOUNTER — Ambulatory Visit: Payer: Self-pay | Attending: Family Medicine | Admitting: Family Medicine

## 2019-12-15 VITALS — BP 141/86 | HR 78 | Temp 99.6°F | Ht 74.0 in | Wt 303.6 lb

## 2019-12-15 DIAGNOSIS — Z8639 Personal history of other endocrine, nutritional and metabolic disease: Secondary | ICD-10-CM

## 2019-12-15 DIAGNOSIS — M549 Dorsalgia, unspecified: Secondary | ICD-10-CM

## 2019-12-15 DIAGNOSIS — Z7902 Long term (current) use of antithrombotics/antiplatelets: Secondary | ICD-10-CM

## 2019-12-15 DIAGNOSIS — I824Z1 Acute embolism and thrombosis of unspecified deep veins of right distal lower extremity: Secondary | ICD-10-CM

## 2019-12-15 DIAGNOSIS — E669 Obesity, unspecified: Secondary | ICD-10-CM

## 2019-12-15 DIAGNOSIS — Z09 Encounter for follow-up examination after completed treatment for conditions other than malignant neoplasm: Secondary | ICD-10-CM

## 2019-12-15 DIAGNOSIS — D509 Iron deficiency anemia, unspecified: Secondary | ICD-10-CM

## 2019-12-15 DIAGNOSIS — R739 Hyperglycemia, unspecified: Secondary | ICD-10-CM

## 2019-12-15 DIAGNOSIS — I10 Essential (primary) hypertension: Secondary | ICD-10-CM

## 2019-12-15 DIAGNOSIS — R350 Frequency of micturition: Secondary | ICD-10-CM

## 2019-12-15 DIAGNOSIS — F17200 Nicotine dependence, unspecified, uncomplicated: Secondary | ICD-10-CM

## 2019-12-15 DIAGNOSIS — E876 Hypokalemia: Secondary | ICD-10-CM

## 2019-12-15 DIAGNOSIS — I2699 Other pulmonary embolism without acute cor pulmonale: Secondary | ICD-10-CM

## 2019-12-15 LAB — POCT URINALYSIS DIP (CLINITEK)
Bilirubin, UA: NEGATIVE
Blood, UA: NEGATIVE
Glucose, UA: NEGATIVE mg/dL
Ketones, POC UA: NEGATIVE mg/dL
Leukocytes, UA: NEGATIVE
Nitrite, UA: NEGATIVE
POC PROTEIN,UA: NEGATIVE
Spec Grav, UA: 1.025
Urobilinogen, UA: 0.2 U/dL
pH, UA: 6

## 2019-12-15 LAB — FACTOR 5 LEIDEN

## 2019-12-15 MED ORDER — METHOCARBAMOL 500 MG PO TABS: 500.0000 mg | ORAL_TABLET | Freq: Three times a day (TID) | ORAL | 3 refills | Status: DC | PRN

## 2019-12-15 MED ORDER — AMLODIPINE BESYLATE 5 MG PO TABS: 5.0000 mg | ORAL_TABLET | Freq: Every day | ORAL | 1 refills | Status: DC

## 2019-12-15 MED ORDER — RIVAROXABAN 20 MG PO TABS: 20.0000 mg | ORAL_TABLET | Freq: Every day | ORAL | 3 refills | Status: DC

## 2019-12-15 MED FILL — METHOCARBAMOL 500 MG TABS: 500 | 20 days supply | Qty: 60 | Fill #0

## 2019-12-15 NOTE — Progress Notes (Addendum)
Patient ID: Glenn Conrad, male    DOB: 06/19/1981  MRN: [id number]   SUBJECTIVE:  Glenn Conrad is a 39 y.o. male who presents for hospital f/u. Where: [facility] When: 12/07/2019-12/08/2019 Primary Dx: Acute right lung pulmonary embolus, acute right lower leg DVT, HTN, hypothyroidism  ED visit on 12/10/2019 for right sided chest pain  HPI:       39 year old male, who is a patient of Glenn Denver, NP, who presents status post recent hospitalization due to onset of chest pain and shortness of breath on 12/07/2019 through 12/08/2019 and patient was diagnosed with pulmonary embolism and DVT of the right gastrocnemius vein.  Patient had return emergency department visit on 12/10/2019 due to right mid back pain.  He reports that he is actually had somewhat longstanding issues with a right mid back pain and he is not sure if it was worsened by his recent hospital stay.  He would like a refill of muscle relaxants to help with the pain.  Pain is now around a 4-6 but when he returned to the emergency department the pain was a 8-9 or greater.  He was prescribed oxycodone as well but only had to take this for about 2 to 3 days and now just takes the muscle relaxant as needed.        He has had no prior history of DVT or pulmonary embolism.  Upon questioning, he has had several family members including his mother who developed issues with blood clots.  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  He denies any recent travel or being sedentary for prolonged periods leading up to his DVT and PE.  Patient reports that he is taking the Xarelto starter pack as prescribed.  He denies any unusual bruising or bleeding.  He is very concerned about his overall health.         He was also started on blood pressure medicine during his hospitalization and he is taking the blood pressure medication daily.  Patient realizes that he needs to lose weight to help  improve his health.  He also reports that he was on thyroid medication, levothyroxine in the past but has not been on the medication in more than a year and wonders if he needs to restart this medication.  He does have issues with fatigue as well as weight gain.  On review of systems, patient with right mid back pain, he has noticed some increase in frequency of urination.  Shortness of breath has improved since hospitalization-patient does continue to smoke about 2 packs of cigarettes per week but realizes that he does need to quit and he is interested in smoking cessation.  He denies any left-sided chest pain, no sensation of palpitations.  No current headaches or dizziness.  No abdominal pain-no nausea/vomiting/diarrhea or constipation.  No blood in the stool or black stools.  No recent fever or chills.   Patient Active Problem List   Diagnosis Date Noted  . Acute pulmonary embolus (HCC) 12/07/2019  . Hypertension   . Current smoker      Current Outpatient Medications on File Prior to Visit  Medication Sig Dispense Refill  . RIVAROXABAN (XARELTO) VTE STARTER PACK (15 & 20 MG TABLETS) Follow package directions: Take one 15mg  tablet by mouth twice a day. On day 22 (12/29/2019), switch to one 20mg  tablet once a day. Take with food. 51 each 0   No current facility-administered medications  on file prior to visit.    Allergies  Allergen Reactions  . Lisinopril     Headaches     Social History   Tobacco Use  . Smoking status: Current Every Day Smoker    Packs/day: 0.50  . Smokeless tobacco: Never Used  Substance Use Topics  . Alcohol use: No  . Drug use: No     Family History  Problem Relation Age of Onset  . Diabetes Mother   . Hypertension Mother   . Hypertension Father   . Diabetes Father     Past Surgical History:  Procedure Laterality Date  . WISDOM TOOTH EXTRACTION      ROS: Review of Systems  Constitutional: Positive for fatigue (improving). Negative for chills and  fever.  HENT: Negative for sore throat and trouble swallowing.   Eyes: Negative for photophobia and visual disturbance.  Respiratory: Negative for cough and shortness of breath.   Cardiovascular: Positive for leg swelling (right calf). Negative for chest pain and palpitations.  Gastrointestinal: Negative for abdominal pain, blood in stool, constipation, diarrhea and nausea.  Endocrine: Positive for cold intolerance (gets cold easily since recent hospitalization) and polyuria. Negative for heat intolerance, polydipsia and polyphagia.  Genitourinary: Positive for flank pain and frequency. Negative for dysuria and hematuria.  Musculoskeletal: Positive for back pain. Negative for arthralgias.  Skin: Negative for rash and wound.  Neurological: Negative for dizziness and headaches.  Hematological: Negative for adenopathy. Does not bruise/bleed easily.  Psychiatric/Behavioral: The patient is nervous/anxious (about his health).      PHYSICAL EXAM: BP (!) 141/86   Pulse 78   Temp 99.6 F (37.6 C) (Oral)   Ht 6\' 2"  (1.88 m)   Wt (!) 303 lb 9.6 oz (137.7 kg)   SpO2 99%   BMI 38.98 kg/m    Physical Exam Constitutional:      General: He is not in acute distress.    Appearance: Normal appearance. He is obese. He is not ill-appearing.  Eyes:     General: No scleral icterus.    Extraocular Movements: Extraocular movements intact.     Conjunctiva/sclera: Conjunctivae normal.  Neck:     Vascular: No carotid bruit.  Cardiovascular:     Rate and Rhythm: Normal rate and regular rhythm.  Pulmonary:     Effort: Pulmonary effort is normal.     Breath sounds: Normal breath sounds.  Abdominal:     Palpations: Abdomen is soft.     Tenderness: There is no abdominal tenderness. There is no right CVA tenderness, left CVA tenderness, guarding or rebound.     Comments: Truncal obesity  Musculoskeletal:        General: Swelling and tenderness present.     Cervical back: Normal range of motion and neck  supple. No rigidity or tenderness.     Right lower leg: Edema present.     Left lower leg: No edema.     Comments: Increased right calf size and mild tenderness to palp  Lymphadenopathy:     Cervical: No cervical adenopathy.  Skin:    General: Skin is warm and dry.  Neurological:     General: No focal deficit present.     Mental Status: He is alert and oriented to person, place, and time.  Psychiatric:        Mood and Affect: Mood normal.        Behavior: Behavior normal.         ASSESSMENT AND PLAN: 1. Acute pulmonary embolism, unspecified  pulmonary embolism type, unspecified whether acute cor pulmonale present ([place]); 2.  Acute deep vein thrombosis (DVT) of distal vein of right lower extremity; 11.  Hospital discharge follow-up Records from patient's recent hospitalization as well as recent ED visit reviewed and discussed with the patient.  He will be referred to hematology due to family history of blood clotting.  Patient also had abnormal lupus anticoagulant blood test during hospitalization.  Patient is to continue his Xarelto starter pack and then start Xarelto 20 mg once daily.  Spoke with clinical pharmacist due to patient's obesity but since patient's BMI is less than 40, patient can take standard 20 mg once daily Xarelto dose after completing starter pack.  Also discussed importance of smoking cessation with the patient. - Ambulatory referral to Hematology - rivaroxaban (XARELTO) 20 MG TABS tablet; Take 1 tablet (20 mg total) by mouth daily with breakfast. Please start after you have completed the starter pack.  Dispense: 30 tablet; Refill: 3  3. Essential hypertension Refill provided of amlodipine 5 mg for continued treatment of hypertension.  Low-sodium diet and weight loss encouraged. - amLODipine (NORVASC) 5 MG tablet; Take 1 tablet (5 mg total) by mouth daily. To lower blood pressure  Dispense: 90 tablet; Refill: 1  4. Mid back pain on right side Patient with right mid  back pain and complaint of some urinary frequency.  Will check urinalysis to look for possible urinary tract infection or hematuria suggestive of kidney stone.  Based on exam, pain is likely musculoskeletal in origin.  Prescription provided for refill of Robaxin. - POCT URINALYSIS DIP (CLINITEK) - methocarbamol (ROBAXIN) 500 MG tablet; Take 1 tablet (500 mg total) by mouth every 8 (eight) hours as needed for muscle spasms.  Dispense: 60 tablet; Refill: 3  5. History of hypothyroidism He was on levothyroxine in the past for hypothyroidism but has not been on this medication for more than a year per his report.  He will have T4 and TSH at today's visit and will be notified if labs indicate the need to resume use of medication for hypothyroidism treatment. - T4 AND TSH  6. Elevated blood sugar Patient with elevated blood sugars on recent blood work and complaint of urinary frequency.  He will have basic metabolic panel and hemoglobin A1c at today's visit in follow-up.  Low carbohydrate diet, and weight loss encouraged. - Basic Metabolic Panel - Hemoglobin A1c  7. Urinary frequency He reports recent onset of urinary frequency.  Will check urinalysis to look for urinary tract infection, BMP and hemoglobin A1c to look for elevated blood sugar/prediabetes or diabetes as a cause or contributing factor to his urinary frequency. - POCT URINALYSIS DIP (CLINITEK) - Basic Metabolic Panel - Hemoglobin A1c  8. Tobacco dependence Discussed smoking cessation in which patient is interested.  He agrees to be referred to the clinical pharmacist for additional help with smoking cessation and clinical pharmacist introduced himself to the patient at today's visit.  9. Microcytic anemia; 10. current use of antithrombotics/antiplatelets   Patient with mild microcytic anemia at time of hospital discharge and is on blood thinning medication and reports sensation of feeling cold and will have CBC at today's visit and  follow-up of anemia and use of blood thinning medication - CBC  12. Hypokalemia Hypokalemia on recent blood work review of chart and he will have BMP in follow-up. - Basic Metabolic Panel  13. Obesity (BMI 30-39.9) Discussed the need for low-fat diet and low impact exercise such as walking to  help with hypertension, prevention of diabetes and overall health  Future Appointments  Date Time Provider Department Center  12/29/2019 11:00 AM Lois Huxley, Cornelius Moras, RPH-CPP [facility] None  01/05/2020  8:30 AM Cain Saupe, MD [facility] None  01/09/2020  9:20 AM Barbette Merino, NP [facility] None    An After Visit Summary was printed and given to the patient.  More than 40 minutes of face-to-face time was spent with the patient in follow-up of his recent hospital discharge and additional 15 minutes spent on review of chart, discussion of patient with clinical pharmacist and completion of today's note.  Cain Saupe, MD, Jerrel Ivory

## 2019-12-15 NOTE — Progress Notes (Signed)
Per pt his right side hurts and radiates to his neck  Per pt he is here for Hospital f/u for PE

## 2019-12-15 NOTE — Patient Instructions (Signed)
Pulmonary Embolism  A pulmonary embolism (PE) is a sudden blockage or decrease of blood flow in one or both lungs. Most blockages come from a blood clot that forms in the vein of a lower leg, thigh, or arm (deep vein thrombosis, DVT) and travels to the lungs. A clot is blood that has thickened into a gel or solid. PE is a dangerous and life-threatening condition that needs to be treated right away. What are the causes? This condition is usually caused by a blood clot that forms in a vein and moves to the lungs. In rare cases, it may be caused by air, fat, part of a tumor, or other tissue that moves through the veins and into the lungs. What increases the risk? The following factors may make you more likely to develop this condition:  Experiencing a traumatic injury, such as breaking a hip or leg.  Having: ? A spinal cord injury. ? Orthopedic surgery, especially hip or knee replacement. ? Any major surgery. ? A stroke. ? DVT. ? Blood clots or blood clotting disease. ? Long-term (chronic) lung or heart disease. ? Cancer treated with chemotherapy. ? A central venous catheter.  Taking medicines that contain estrogen. These include birth control pills and hormone replacement therapy.  Being: ? Pregnant. ? In the period of time after your baby is delivered (postpartum). ? Older than age 60. ? Overweight. ? A smoker, especially if you have other risks. What are the signs or symptoms? Symptoms of this condition usually start suddenly and include:  Shortness of breath during activity or at rest.  Coughing, coughing up blood, or coughing up blood-tinged mucus.  Chest pain that is often worse with deep breaths.  Rapid or irregular heartbeat.  Feeling light-headed or dizzy.  Fainting.  Feeling anxious.  Fever.  Sweating.  Pain and swelling in a leg. This is a symptom of DVT, which can lead to PE. How is this diagnosed? This condition may be diagnosed based on:  Your medical  history.  A physical exam.  Blood tests.  CT pulmonary angiogram. This test checks blood flow in and around your lungs.  Ventilation-perfusion scan, also called a lung VQ scan. This test measures air flow and blood flow to the lungs.  An ultrasound of the legs. How is this treated? Treatment for this condition depends on many factors, such as the cause of your PE, your risk for bleeding or developing more clots, and other medical conditions you have. Treatment aims to remove, dissolve, or stop blood clots from forming or growing larger. Treatment may include:  Medicines, such as: ? Blood thinning medicines (anticoagulants) to stop clots from forming. ? Medicines that dissolve clots (thrombolytics).  Procedures, such as: ? Using a flexible tube to remove a blood clot (embolectomy) or to deliver medicine to destroy it (catheter-directed thrombolysis). ? Inserting a filter into a large vein that carries blood to the heart (inferior vena cava). This filter (vena cava filter) catches blood clots before they reach the lungs. ? Surgery to remove the clot (surgical embolectomy). This is rare. You may need a combination of immediate, long-term (up to 3 months after diagnosis), and extended (more than 3 months after diagnosis) treatments. Your treatment may continue for several months (maintenance therapy). You and your health care provider will work together to choose the treatment program that is best for you. Follow these instructions at home: Medicines  Take over-the-counter and prescription medicines only as told by your health care provider.  If you   are taking an anticoagulant medicine: ? Take the medicine every day at the same time each day. ? Understand what foods and drugs interact with your medicine. ? Understand the side effects of this medicine, including excessive bruising or bleeding. Ask your health care provider or pharmacist about other side effects. General  instructions  Wear a medical alert bracelet or carry a medical alert card that says you have had a PE and lists what medicines you take.  Ask your health care provider when you may return to your normal activities. Avoid sitting or lying for a long time without moving.  Maintain a healthy weight. Ask your health care provider what weight is healthy for you.  Do not use any products that contain nicotine or tobacco, such as cigarettes, e-cigarettes, and chewing tobacco. If you need help quitting, ask your health care provider.  Talk with your health care provider about any travel plans. It is important to make sure that you are still able to take your medicine while on trips.  Keep all follow-up visits as told by your health care provider. This is important. Contact a health care provider if:  You missed a dose of your blood thinner medicine. Get help right away if:  You have: ? New or increased pain, swelling, warmth, or redness in an arm or leg. ? Numbness or tingling in an arm or leg. ? Shortness of breath during activity or at rest. ? A fever. ? Chest pain. ? A rapid or irregular heartbeat. ? A severe headache. ? Vision changes. ? A serious fall or accident, or you hit your head. ? Stomach (abdominal) pain. ? Blood in your vomit, stool, or urine. ? A cut that will not stop bleeding.  You cough up blood.  You feel light-headed or dizzy.  You cannot move your arms or legs.  You are confused or have memory loss. These symptoms may represent a serious problem that is an emergency. Do not wait to see if the symptoms will go away. Get medical help right away. Call your local emergency services (911 in the [place] Do not drive yourself to the hospital. Summary  A pulmonary embolism (PE) is a sudden blockage or decrease of blood flow in one or both lungs. PE is a dangerous and life-threatening condition that needs to be treated right away.  Treatments for this condition usually  include medicines to thin your blood (anticoagulants) or medicines to break apart blood clots (thrombolytics).  If you are given blood thinners, it is important to take the medicine every day at the same time each day.  Understand what foods and drugs interact with any medicines that you are taking.  If you have signs of PE or DVT, call your local emergency services (911 in the [place] This information is not intended to replace advice given to you by your health care provider. Make sure you discuss any questions you have with your health care provider. Document Revised: 05/08/2018 Document Reviewed: 05/08/2018 [employer] Patient Education  2020 [employer]  

## 2019-12-16 LAB — BASIC METABOLIC PANEL WITH GFR
BUN/Creatinine Ratio: 13 (ref 9–20)
BUN: 12 mg/dL (ref 6–24)
CO2: 24 mmol/L (ref 20–29)
Calcium: 10.1 mg/dL (ref 8.7–10.2)
Chloride: 99 mmol/L (ref 96–106)
Creatinine, Ser: 0.93 mg/dL (ref 0.76–1.27)
GFR calc Af Amer: 118 mL/min/1.73
GFR calc non Af Amer: 102 mL/min/1.73
Glucose: 77 mg/dL (ref 65–99)
Potassium: 4.6 mmol/L (ref 3.5–5.2)
Sodium: 142 mmol/L (ref 134–144)

## 2019-12-16 LAB — CBC
Hematocrit: 36.1 % — ABNORMAL LOW (ref 37.5–51.0)
Hemoglobin: 11.3 g/dL — ABNORMAL LOW (ref 13.0–17.7)
MCH: 27.2 pg (ref 26.6–33.0)
MCHC: 31.3 g/dL — ABNORMAL LOW (ref 31.5–35.7)
MCV: 87 fL (ref 79–97)
Platelets: 268 x10E3/uL (ref 150–450)
RBC: 4.15 x10E6/uL (ref 4.14–5.80)
RDW: 14.5 % (ref 11.6–15.4)
WBC: 10.6 x10E3/uL (ref 3.4–10.8)

## 2019-12-16 LAB — T4 AND TSH
T4, Total: 6.3 ug/dL (ref 4.5–12.0)
TSH: 14.8 u[IU]/mL — ABNORMAL HIGH (ref 0.450–4.500)

## 2019-12-18 ENCOUNTER — Telehealth: Payer: Self-pay | Admitting: Hematology and Oncology

## 2019-12-18 LAB — SPECIMEN STATUS REPORT

## 2019-12-18 LAB — HEMOGLOBIN A1C
Est. average glucose Bld gHb Est-mCnc: 163 mg/dL
Hgb A1c MFr Bld: 7.3 % — ABNORMAL HIGH (ref 4.8–5.6)

## 2019-12-18 NOTE — Telephone Encounter (Signed)
Received a new hem referral from Bertram Denver, NP at [facility] for DVT. Glenn Conrad has been cld and scheduled to see Glenn Conrad on 5/21 at 9am. Pt aware to arrive 15 minutes early.

## 2019-12-23 ENCOUNTER — Ambulatory Visit: Payer: Self-pay | Admitting: Nurse Practitioner

## 2019-12-27 ENCOUNTER — Other Ambulatory Visit: Payer: Self-pay | Admitting: Nurse Practitioner

## 2019-12-27 MED ORDER — METFORMIN HCL 500 MG PO TABS
1000.0000 mg | ORAL_TABLET | Freq: Two times a day (BID) | ORAL | 3 refills | Status: DC
Start: 2019-12-27 — End: 2020-01-03

## 2019-12-27 MED ORDER — LEVOTHYROXINE SODIUM 88 MCG PO TABS
88.0000 ug | ORAL_TABLET | Freq: Every day | ORAL | 3 refills | Status: DC
Start: 2019-12-27 — End: 2020-02-19

## 2019-12-29 ENCOUNTER — Ambulatory Visit: Payer: Self-pay | Admitting: Pharmacist

## 2019-12-29 MED FILL — METFORMIN HCL 500 MG TABS: 500 | 30 days supply | Qty: 120 | Fill #0

## 2019-12-29 MED FILL — LEVOTHYROXINE 88 MCG TABLET: 88 | 30 days supply | Qty: 30 | Fill #0

## 2019-12-31 ENCOUNTER — Other Ambulatory Visit: Payer: Self-pay

## 2019-12-31 ENCOUNTER — Ambulatory Visit: Payer: Self-pay | Attending: Nurse Practitioner | Admitting: Pharmacist

## 2019-12-31 ENCOUNTER — Ambulatory Visit: Payer: Self-pay

## 2019-12-31 DIAGNOSIS — Z716 Tobacco abuse counseling: Secondary | ICD-10-CM

## 2019-12-31 MED ORDER — NICOTINE POLACRILEX 4 MG MT GUM: 4.0000 mg | CHEWING_GUM | OROMUCOSAL | 0 refills | Status: DC | PRN

## 2019-12-31 MED ORDER — NICOTINE 14 MG/24HR TD PT24: 14.0000 mg | MEDICATED_PATCH | Freq: Every day | TRANSDERMAL | 1 refills | Status: DC

## 2019-12-31 MED FILL — AMLODIPINE BESYLATE 5 MG TA: 5 | 30 days supply | Qty: 30 | Fill #0

## 2019-12-31 MED FILL — XARELTO 20 MG TABLET: 20 | 30 days supply | Qty: 30 | Fill #0

## 2019-12-31 MED FILL — NICOTINE 14 MG/24HR PATCH: 14 | 28 days supply | Qty: 28 | Fill #0

## 2019-12-31 NOTE — Progress Notes (Signed)
S:  Patient was reviewed by clinical pharmacist for assistance with tobacco cessation. Patient identity was verified using date of birth and address.  Tobacco Use History  Age when started using tobacco on a daily basis: no age given - reports "early teens".  Type: cigarettes.  Number of cigarettes per day 5-6, brand [place].  Estimated nicotine content: 6-7.2 mg per day.   Smokes first cigarette upon awaking.  Does not wake at night to smoke.  Triggers include psychological: after meals; physical: cigarettes within 30 minutes of a.m.  Quit Attempt History   Most recent quit attempt: NRT during most recent hospitalization.  Longest time ever been tobacco free: years in adolescence before smoking.  Methods tried in the past include Nicotine Patch.   Rates IMPORTANCE of quitting tobacco on 1-10 scale of 10.  Rates READINESS of quitting tobacco on 1-10 scale of 10.  Rates CONFIDENCE of quitting tobacco on 1-10 scale of 5.  Motivators to quitting include: personal health; barriers include spouse/partner smokes   A/P: Nicotine dependence: moderate, >20 years duration in a patient who is good candidate for success.     Reviewed options and patient reports interest in NRT. Initiated Nicotine Patch with gum for breakthrough. Treatment was reviewed with the patient, including name, instructions, goals of therapy, potential side effects, importance of adherence, and safe use.  Reviewed potential challenges and coping skills/strategies with patient. Provided information on 1 800-QUIT NOW support program and advised patient to contact me if questions/concerns arise. Patient verbalized understanding of information by repeating back.  Follow up in 2 month(s)  Horald Pollen [place] 10:30 AM 12/31/2019

## 2020-01-01 ENCOUNTER — Encounter: Payer: Self-pay | Admitting: Pharmacist

## 2020-01-01 ENCOUNTER — Telehealth: Payer: Self-pay

## 2020-01-01 NOTE — Telephone Encounter (Signed)
Pt states Metformin is making him have diarrhea. Pt states cannot tolerate it and wants new diabetic medication.

## 2020-01-01 NOTE — Progress Notes (Signed)
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 CT PE study showed acute pulmonary infarction in the right lung, most severe in the right lower lobe where there is an occlusive embolus and probable area of pulmonary hemorrhage in the posterior aspect of the right lower lobe from pulmonary infarction. 1) 12/08/2019:Bilateral LE US showed findings consistent with acute deep vein thrombosis involving the right  gastrocnemius veins. No DVT noted in the left leg. Started on Xarelto therapy.  2) 01/02/2020: establish care with Dr. Leonides Schanz   CHIEF COMPLAINTS/PURPOSE OF CONSULTATION:  "Pulmonary Embolism/ Right Lower Extremity DVT "  HISTORY OF PRESENTING ILLNESS:  Glenn Conrad 38 y.o. male with medical history significant for hypothyroidism, HTN, and obesity who presents for evaluation of a newly diagnosed PE/RLE DVT.   On review of the previous records Glenn Conrad initially presented the emergency department on 12/07/2019 with complaints of progressively worsening chest pain with associated shortness of breath for the prior 7 days.  The pain was reportedly sharp and constant with pain on deep inspiration.  Given these findings the patient underwent a CT PE study which showed an acute pulmonary infarct in the right lung, most severe in the right lower lobe where there is an occlusive embolus and probable area of pulmonary hemorrhage with some concern for pulmonary infarct.  On 426.21 the patient underwent bilateral lower extremity ultrasounds which showed an acute right lower extremity DVT in the right gastrocnemius veins.  There is no DVT noted in left leg.  He was started on heparin therapy with an transition to  Xarelto therapy at time of discharge.  As part of his initial evaluation he underwent a hypercoagulation series which showed decreased protein S activity, but otherwise no abnormalities were noted.  Due to concern for this unprovoked set of VTE's the patient was referred to hematology for further evaluation management.   On exam today Glenn Conrad is quite tearful about his situation.  He reports that thing after things have gone wrong including his diagnosis of diabetes, his diagnosis of thyroid disease, and now his new history of VTE.  He reports that he has been tolerating the Xarelto therapy well without any overt signs of bleeding at this time.  He notes that most of his trouble has been happening because of his Metformin medication which was causing gastric upset as well as diarrhea.  He notes that he had to self discontinue this medication because it was beginning to interfere with his work.  In regards to his VTE the patient notes that he was driving with his significant other while smoking a cigarette and he developed this "pain like crazy" in his right leg.  Notes that this was not a particular long drive and had only lasted approximately 90 minutes to 2 hours.  He notes that he has never had an issue like this before and he does not have any family history remarkable for a clotting disorder.  Notes that he has never had any injury to his right leg and has not had any surgical procedures performed on it either. Oddly enough it was been his left leg that required a plate and screws after damage during a football game.  On further discussion he notes no fevers, chills, nausea, vomiting, shortness  of breath, or chest pain.  A full 10 point ROS is listed below.  MEDICAL HISTORY:  Past Medical History:  Diagnosis Date  . Hypertension   . PE (pulmonary thromboembolism) (HCC)   . Thyroid disease     SURGICAL HISTORY: Past Surgical History:  Procedure Laterality Date  . WISDOM TOOTH EXTRACTION        SOCIAL HISTORY: Social History   Socioeconomic History  . Marital status: Single    Spouse name: Not on file  . Number of children: Not on file  . Years of education: Not on file  . Highest education level: Not on file  Occupational History  . Not on file  Tobacco Use  . Smoking status: Current Every Day Smoker    Packs/day: 0.50  . Smokeless tobacco: Never Used  Substance and Sexual Activity  . Alcohol use: No  . Drug use: No  . Sexual activity: Yes  Other Topics Concern  . Not on file  Social History Narrative  . Not on file   Social Determinants of Health   Financial Resource Strain:   . Difficulty of Paying Living Expenses:   Food Insecurity:   . Worried About Programme researcher, broadcasting/film/videounning Out of Food in the Last Year:   . Baristaan Out of Food in the Last Year:   Transportation Needs:   . Freight forwarderLack of Transportation (Medical):   Marland Kitchen. Lack of Transportation (Non-Medical):   Physical Activity:   . Days of Exercise per Week:   . Minutes of Exercise per Session:   Stress:   . Feeling of Stress :   Social Connections:   . Frequency of Communication with Friends and Family:   . Frequency of Social Gatherings with Friends and Family:   . Attends Religious Services:   . Active Member of Clubs or Organizations:   . Attends BankerClub or Organization Meetings:   Marland Kitchen. Marital Status:   Intimate Partner Violence:   . Fear of Current or Ex-Partner:   . Emotionally Abused:   Marland Kitchen. Physically Abused:   . Sexually Abused:     FAMILY HISTORY: Family History  Problem Relation Age of Onset  . Diabetes Mother   . Hypertension Mother   . Hypertension Father   . Diabetes Father     ALLERGIES:  is allergic to lisinopril.  MEDICATIONS:  Current Outpatient Medications  Medication Sig Dispense Refill  . amLODipine (NORVASC) 5 MG tablet Take 1 tablet (5 mg total) by mouth daily. To lower blood pressure 90 tablet 1  . levothyroxine (SYNTHROID) 88 MCG tablet Take 1 tablet (88 mcg total) by mouth daily. 90 tablet 3   . metFORMIN (GLUCOPHAGE) 500 MG tablet Take 2 tablets (1,000 mg total) by mouth 2 (two) times daily with a meal. 180 tablet 3  . methocarbamol (ROBAXIN) 500 MG tablet Take 1 tablet (500 mg total) by mouth every 8 (eight) hours as needed for muscle spasms. 60 tablet 3  . nicotine (NICODERM CQ - DOSED IN MG/24 HOURS) 14 mg/24hr patch Place 1 patch (14 mg total) onto the skin daily. 28 patch 1  . nicotine polacrilex (NICORETTE) 4 MG gum Take 1 each (4 mg total) by mouth as needed for smoking cessation. 100 tablet 0  . rivaroxaban (XARELTO) 20 MG TABS tablet Take 1 tablet (20 mg total) by mouth daily with breakfast. Please start after you have completed the starter pack. 30 tablet 3  . RIVAROXABAN (XARELTO) VTE STARTER PACK (15 & 20 MG TABLETS) Follow package directions: Take one  15mg  tablet by mouth twice a day. On day 22 (12/29/2019), switch to one 20mg  tablet once a day. Take with food. 51 each 0   No current facility-administered medications for this visit.    REVIEW OF SYSTEMS:   Constitutional: ( - ) fevers, ( - )  chills , ( +) sweats Eyes: ( - ) blurriness of vision, ( - ) double vision, ( - ) watery eyes Ears, nose, mouth, throat, and face: ( - ) mucositis, ( - ) sore throat Respiratory: ( - ) cough, ( - ) dyspnea, ( - ) wheezes Cardiovascular: ( - ) palpitation, ( - ) chest discomfort, ( + ) lower extremity swelling Gastrointestinal:  ( - ) nausea, ( - ) heartburn, (+) diarrhea Skin: ( - ) abnormal skin rashes Lymphatics: ( - ) new lymphadenopathy, ( - ) easy bruising Neurological: ( - ) numbness, ( - ) tingling, ( - ) new weaknesses Behavioral/Psych: ( - ) mood change, ( - ) new changes  All other systems were reviewed with the patient and are negative.  PHYSICAL EXAMINATION: ECOG PERFORMANCE STATUS: 1 - Symptomatic but completely ambulatory  There were no vitals filed for this visit. There were no vitals filed for this visit.  GENERAL: well appearing obese middle aged 12/31/2019  American male in NAD  SKIN: skin color, texture, turgor are normal, no rashes or significant lesions EYES: conjunctiva are pink and non-injected, sclera clear LUNGS: clear to auscultation and percussion with normal breathing effort HEART: regular rate & rhythm and no murmurs and no lower extremity edema Musculoskeletal: no cyanosis of digits and no clubbing  PSYCH: alert & oriented x 3, fluent speech NEURO: no focal motor/sensory deficits  LABORATORY DATA:  I have reviewed the data as listed CBC Latest Ref Rng & Units 12/15/2019 12/08/2019 12/06/2019  WBC 3.4 - 10.8 x10E3/uL 10.6 10.2 10.5  Hemoglobin 13.0 - 17.7 g/dL 11.3(L) 12.4(L) 13.0  Hematocrit 37.5 - 51.0 % 36.1(L) 38.6(L) 39.4  Platelets 150 - 450 x10E3/uL 268 248 235    CMP Latest Ref Rng & Units 12/15/2019 12/08/2019 12/06/2019  Glucose 65 - 99 mg/dL 77 12/10/2019) 12/08/2019)  BUN 6 - 24 mg/dL 12 11 12   Creatinine 0.76 - 1.27 mg/dL 300(T 622(Q  Sodium 134 - 144 mmol/L 142 139 136  Potassium 3.5 - 5.2 mmol/L 4.6 3.9 4.0  Chloride 96 - 106 mmol/L 99 99 98  CO2 20 - 29 mmol/L 24 27 28   Calcium 8.7 - 10.2 mg/dL 3.33 9.5 9.1  Total Protein 6.0 - 8.5 g/dL - - -  Total Bilirubin 0.0 - 1.2 mg/dL - - -  Alkaline Phos 39 - 117 IU/L - - -  AST 0 - 40 IU/L - - -  ALT 0 - 44 IU/L - - -    RADIOGRAPHIC STUDIES: I have personally reviewed the radiological images as listed and agreed with the findings in the report: PE in the right lung.  DG Chest 2 View  Result Date: 12/06/2019 CLINICAL DATA:  Chest pain EXAM: CHEST - 2 VIEW COMPARISON:  December 02, 2019 FINDINGS: The heart size and mediastinal contours are within normal limits. There is mild prominence of the central pulmonary vasculature. Streaky atelectasis seen in the right mid lung. No large airspace consolidation or pleural effusion. No acute osseous abnormality. IMPRESSION: Prominence of the central pulmonary vasculature which may be due to mild pulmonary vascular congestion. Streaky  atelectasis in the mid right lung. Electronically Signed   By:  Avutu M.D.   On: 12/06/2019 22:07   CT Angio Chest PE W and/or Wo Contrast  Result Date: 12/07/2019 CLINICAL DATA:  39 year old male with chest pain for 1 week. Pain is most severe on the right side with radiation to the ribs and back. Most severe when coughing or sneezing. EXAM: CT ANGIOGRAPHY CHEST WITH CONTRAST TECHNIQUE: Multidetector CT imaging of the chest was performed using the standard protocol during bolus administration of intravenous contrast. Multiplanar CT image reconstructions and MIPs were obtained to evaluate the vascular anatomy. CONTRAST:  OMNIPAQUE IOHEXOL 350 MG/ML SOLN COMPARISON:  No priors. FINDINGS: Comment: Today's study is slightly limited by patient respiratory motion. Cardiovascular: Several filling defects are noted within right sided pulmonary artery branches including both segmental and subsegmental sized branches to the right upper and right lower lobes, compatible with pulmonary embolism. In the right lower lobe there is some occlusive embolus extending to the posterior basal segment (axial image 138 of series 5). Heart size is borderline enlarged. There is no significant pericardial fluid, thickening or pericardial calcification. No atherosclerotic calcifications in the thoracic aorta or the coronary arteries. Mediastinum/Nodes: Multiple borderline enlarged mediastinal lymph nodes. Mildly enlarged right hilar lymph node measuring up to 1.5 cm in short axis. Esophagus is unremarkable in appearance. No axillary lymphadenopathy. Lungs/Pleura: Peripheral airspace consolidation in the posterior aspect of the right lower lobe likely to reflect pulmonary hemorrhage in the setting of pulmonary infarction. There is a small right pleural effusion adjacent to this. Linear scarring or subsegmental atelectasis in the right upper lobe peripherally. Mild diffuse bronchial wall thickening with mild paraseptal  emphysema. Upper Abdomen: Unremarkable. Musculoskeletal: There are no aggressive appearing lytic or blastic lesions noted in the visualized portions of the skeleton. Review of the MIP images confirms the above findings. IMPRESSION: 1. Study is positive for acute pulmonary infarction in the right lung, most severe in the right lower lobe where there is an occlusive embolus and probable area of pulmonary hemorrhage in the posterior aspect of the right lower lobe from pulmonary infarction. 2. Small right pleural effusion lying dependently. 3. Mildly enlarged right hilar lymph node and borderline enlarged mediastinal lymph nodes. These are nonspecific and potentially reactive. 4. Mild diffuse bronchial wall thickening with mild paraseptal emphysema; imaging findings suggestive of underlying COPD. Emphysema (ICD10-J43.9). Electronically Signed   By: Trudie Reed M.D.   On: 12/07/2019 04:38   ECHOCARDIOGRAM COMPLETE  Result Date: 12/07/2019    ECHOCARDIOGRAM REPORT   Patient Name:   Glenn Conrad Date of Exam: 12/07/2019 Medical Rec #:  [id number]      Height:       74.0 in Accession #:    [id number]     Weight:       312.0 lb Date of Birth:  12/16/1980      BSA:          2.626 m Patient Age:    38 years       BP:           138/81 mmHg Patient Gender: M              HR:           80 bpm. Exam Location:  Inpatient Procedure: 2D Echo Indications:    pulmonary embolus  History:        Patient has no prior history of Echocardiogram examinations.                 Risk Factors:Current Smoker and Hypertension.  Sonographer:    Celene Skeen RDCS (AE) Referring Phys: [id number] TIMOTHY S OPYD  Sonographer Comments: limited mobility. off axis apical windows IMPRESSIONS  1. Left ventricular ejection fraction, by estimation, is 60 to 65%. The left ventricle has normal function. The left ventricle has no regional wall motion abnormalities. There is moderate left ventricular hypertrophy. Left ventricular diastolic parameters were  normal.  2. Right ventricular systolic function is mildly reduced. The right ventricular size is mildly enlarged.  3. The mitral valve is normal in structure. No evidence of mitral valve regurgitation. No evidence of mitral stenosis.  4. The aortic valve is normal in structure. Aortic valve regurgitation is not visualized. No aortic stenosis is present.  5. The inferior vena cava is normal in size with greater than 50% respiratory variability, suggesting right atrial pressure of 3 mmHg. FINDINGS  Left Ventricle: Left ventricular ejection fraction, by estimation, is 60 to 65%. The left ventricle has normal function. The left ventricle has no regional wall motion abnormalities. The left ventricular internal cavity size was normal in size. There is  moderate left ventricular hypertrophy. Left ventricular diastolic parameters were normal. Right Ventricle: The right ventricular size is mildly enlarged. No increase in right ventricular wall thickness. Right ventricular systolic function is mildly reduced. Left Atrium: Left atrial size was normal in size. Right Atrium: Right atrial size was normal in size. Pericardium: There is no evidence of pericardial effusion. Mitral Valve: The mitral valve is normal in structure. Normal mobility of the mitral valve leaflets. No evidence of mitral valve regurgitation. No evidence of mitral valve stenosis. Tricuspid Valve: The tricuspid valve is normal in structure. Tricuspid valve regurgitation is not demonstrated. No evidence of tricuspid stenosis. Aortic Valve: The aortic valve is normal in structure. Aortic valve regurgitation is not visualized. No aortic stenosis is present. Pulmonic Valve: The pulmonic valve was normal in structure. Pulmonic valve regurgitation is not visualized. No evidence of pulmonic stenosis. Aorta: The aortic root is normal in size and structure. Venous: The inferior vena cava is normal in size with greater than 50% respiratory variability, suggesting right  atrial pressure of 3 mmHg. IAS/Shunts: No atrial level shunt detected by color flow Doppler.  LEFT VENTRICLE PLAX 2D LVIDd:         4.70 cm  Diastology LVIDs:         3.10 cm  LV e' lateral:   9.14 cm/s LV PW:         1.50 cm  LV E/e' lateral: 5.9 LV IVS:        1.40 cm  LV e' medial:    8.16 cm/s LVOT diam:     2.50 cm  LV E/e' medial:  6.6 LV SV:         46 LV SV Index:   18 LVOT Area:     4.91 cm  RIGHT VENTRICLE RV S prime:     14.10 cm/s TAPSE (M-mode): 1.9 cm LEFT ATRIUM           Index       RIGHT ATRIUM           Index LA diam:      4.40 cm 1.68 cm/m  RA Area:     16.10 cm LA Vol (A4C): 50.5 ml 19.23 ml/m RA Volume:   37.60 ml  14.32 ml/m  AORTIC VALVE LVOT Vmax:   70.50 cm/s LVOT Vmean:  53.200 cm/s LVOT VTI:    0.094 m  AORTA Ao Root diam: 3.40 cm  MITRAL VALVE MV Area (PHT): 2.62 cm    SHUNTS MV Decel Time: 290 msec    Systemic VTI:  0.09 m MV E velocity: 54.00 cm/s  Systemic Diam: 2.50 cm MV A velocity: 49.70 cm/s MV E/A ratio:  1.09 Candee Furbish MD Electronically signed by Candee Furbish MD Signature Date/Time: 12/07/2019/2:15:09 PM    Final    VAS [place] LOWER EXTREMITY VENOUS (DVT)  Result Date: 12/08/2019  Lower Venous DVTStudy Indications: Pulmonary embolism.  Risk Factors: Confirmed PE. Anticoagulation: Heparin. Limitations: Body habitus and poor ultrasound/tissue interface. Comparison Study: No prior studies. Performing Technologist: Oliver Hum RVT  Examination Guidelines: A complete evaluation includes B-mode imaging, spectral Doppler, color Doppler, and power Doppler as needed of all accessible portions of each vessel. Bilateral testing is considered an integral part of a complete examination. Limited examinations for reoccurring indications may be performed as noted. The reflux portion of the exam is performed with the patient in reverse Trendelenburg.  +---------+---------------+---------+-----------+----------+--------------+ RIGHT     CompressibilityPhasicitySpontaneityPropertiesThrombus Aging +---------+---------------+---------+-----------+----------+--------------+ CFV      Full           Yes      Yes                                 +---------+---------------+---------+-----------+----------+--------------+ SFJ      Full                                                        +---------+---------------+---------+-----------+----------+--------------+ FV Prox  Full                                                        +---------+---------------+---------+-----------+----------+--------------+ FV Mid   Full                                                        +---------+---------------+---------+-----------+----------+--------------+ FV DistalFull                                                        +---------+---------------+---------+-----------+----------+--------------+ PFV      Full                                                        +---------+---------------+---------+-----------+----------+--------------+ POP      Full           Yes      Yes                                 +---------+---------------+---------+-----------+----------+--------------+ PTV  Full                                                        +---------+---------------+---------+-----------+----------+--------------+ PERO                                                  Not visualized +---------+---------------+---------+-----------+----------+--------------+ Gastroc  None                                         Acute          +---------+---------------+---------+-----------+----------+--------------+   +---------+---------------+---------+-----------+----------+--------------+ LEFT     CompressibilityPhasicitySpontaneityPropertiesThrombus Aging +---------+---------------+---------+-----------+----------+--------------+ CFV      Full           Yes      Yes                                  +---------+---------------+---------+-----------+----------+--------------+ SFJ      Full                                                        +---------+---------------+---------+-----------+----------+--------------+ FV Prox  Full                                                        +---------+---------------+---------+-----------+----------+--------------+ FV Mid   Full                                                        +---------+---------------+---------+-----------+----------+--------------+ FV Distal               Yes      Yes                                 +---------+---------------+---------+-----------+----------+--------------+ PFV      Full                                                        +---------+---------------+---------+-----------+----------+--------------+ POP      Full           Yes      Yes                                 +---------+---------------+---------+-----------+----------+--------------+ PTV      Full                                                        +---------+---------------+---------+-----------+----------+--------------+  PERO                                                  Not visualized +---------+---------------+---------+-----------+----------+--------------+     Summary: RIGHT: - Findings consistent with acute deep vein thrombosis involving the right gastrocnemius veins. - No cystic structure found in the popliteal fossa.  LEFT: - There is no evidence of deep vein thrombosis in the lower extremity. However, portions of this examination were limited- see technologist comments above.  - No cystic structure found in the popliteal fossa.  *See table(s) above for measurements and observations. Electronically signed by Sherald Hess MD on 12/08/2019 at 4:39:39 PM.    Final     ASSESSMENT & PLAN Glenn Conrad 39 y.o. male with medical history significant for hypothyroidism, HTN, and  obesity who presents for evaluation of a newly diagnosed PE/RLE DVT.  After review the labs, review the outside records, and discussion with the patient his findings are most consistent with an unprovoked right lower extremity DVT and pulmonary embolism.  Given the unprovoked nature of this clot I would recommend indefinite anticoagulation in order to prevent future incidents of VTE.  I have informed the patient that I think this would be the best strategy moving forward.  We can however visit with the patient again approximately 6 months out from his initial dose of anticoagulation therapy in order to discuss decreased to a maintenance dose assuming his symptoms have resolved.  Given that he has such extensive clot at this time I would recommend a full 6 months of anticoagulation prior to consideration of drop to maintenance dosing.  Glenn Conrad voiced his understanding of these findings.  Unfortunately he does seem to have numerous other health conditions causing him trouble at the moment including type 2 diabetes as well as hypothyroidism.  These are under the care of his primary care provider.  The Metformin medication has been causing him significant GI distress and I have recommended that he hold it and talk to his primary care provider about alternatives.  Additionally he had considerable amount of sweating during our visit today and I decided to recheck a TSH to assure that he was not suffering from hyperthyroidism induced by his medication.  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  --decreased Protein S activity in the setting of acute thrombus is of little utility. Will need to  recheck after at least 3 months to assure stability of the clot before the value can be of use.  --no clear evidence of a hypercoagulable disorder based on evaluation performed 12/07/2019.  --recommend f/u in our clinic in Oct 2021 to discuss continued anticoagulation therapy with consideration to maintenance dose DOAC.   No orders of the defined types were placed in this encounter.   All questions were answered. The patient knows to call the clinic with any problems, questions or concerns.  A total of more than 60 minutes were spent on this encounter and over half of that time was spent on counseling and coordination of care as outlined above.   Ulysees Barns,  MD Department of Hematology/Oncology [facility] Phone: [phone] Pager: [phone] Email: Jonny [email or web address]  01/01/2020 4:25 PM

## 2020-01-01 NOTE — Telephone Encounter (Signed)
Will route to PCP 

## 2020-01-02 ENCOUNTER — Inpatient Hospital Stay: Payer: Self-pay | Attending: Hematology and Oncology | Admitting: Hematology and Oncology

## 2020-01-02 ENCOUNTER — Inpatient Hospital Stay: Payer: Self-pay

## 2020-01-02 ENCOUNTER — Other Ambulatory Visit: Payer: Self-pay

## 2020-01-02 VITALS — BP 131/85 | HR 72 | Temp 97.5°F | Resp 18 | Ht 74.0 in | Wt 307.1 lb

## 2020-01-02 DIAGNOSIS — E039 Hypothyroidism, unspecified: Secondary | ICD-10-CM | POA: Insufficient documentation

## 2020-01-02 DIAGNOSIS — I119 Hypertensive heart disease without heart failure: Secondary | ICD-10-CM | POA: Insufficient documentation

## 2020-01-02 DIAGNOSIS — I2609 Other pulmonary embolism with acute cor pulmonale: Secondary | ICD-10-CM

## 2020-01-02 DIAGNOSIS — I2699 Other pulmonary embolism without acute cor pulmonale: Secondary | ICD-10-CM | POA: Insufficient documentation

## 2020-01-02 DIAGNOSIS — Z79899 Other long term (current) drug therapy: Secondary | ICD-10-CM | POA: Insufficient documentation

## 2020-01-02 DIAGNOSIS — I82461 Acute embolism and thrombosis of right calf muscular vein: Secondary | ICD-10-CM

## 2020-01-02 DIAGNOSIS — Z7901 Long term (current) use of anticoagulants: Secondary | ICD-10-CM | POA: Insufficient documentation

## 2020-01-02 DIAGNOSIS — I82401 Acute embolism and thrombosis of unspecified deep veins of right lower extremity: Secondary | ICD-10-CM | POA: Insufficient documentation

## 2020-01-02 DIAGNOSIS — K219 Gastro-esophageal reflux disease without esophagitis: Secondary | ICD-10-CM | POA: Insufficient documentation

## 2020-01-02 DIAGNOSIS — E119 Type 2 diabetes mellitus without complications: Secondary | ICD-10-CM | POA: Insufficient documentation

## 2020-01-02 DIAGNOSIS — F17210 Nicotine dependence, cigarettes, uncomplicated: Secondary | ICD-10-CM | POA: Insufficient documentation

## 2020-01-02 DIAGNOSIS — Z7984 Long term (current) use of oral hypoglycemic drugs: Secondary | ICD-10-CM | POA: Insufficient documentation

## 2020-01-02 DIAGNOSIS — E669 Obesity, unspecified: Secondary | ICD-10-CM | POA: Insufficient documentation

## 2020-01-02 LAB — CMP (CANCER CENTER ONLY)
ALT: 35 U/L (ref 0–44)
AST: 20 U/L (ref 15–41)
Albumin: 4 g/dL (ref 3.5–5.0)
Alkaline Phosphatase: 70 U/L (ref 38–126)
Anion gap: 9 (ref 5–15)
BUN: 12 mg/dL (ref 6–20)
CO2: 31 mmol/L (ref 22–32)
Calcium: 9.8 mg/dL (ref 8.9–10.3)
Chloride: 103 mmol/L (ref 98–111)
Creatinine: 1 mg/dL (ref 0.61–1.24)
GFR, Est AFR Am: >60 mL/min (ref >60)
GFR, Estimated: >60 mL/min (ref >60)
Glucose, Bld: 79 mg/dL (ref 70–99)
Potassium: 4.2 mmol/L (ref 3.5–5.1)
Sodium: 143 mmol/L (ref 135–145)
Total Bilirubin: 0.6 mg/dL (ref 0.3–1.2)
Total Protein: 8.2 g/dL — ABNORMAL HIGH (ref 6.5–8.1)

## 2020-01-02 LAB — CBC WITH DIFFERENTIAL (CANCER CENTER ONLY)
Abs Immature Granulocytes: 0.01 10*3/uL (ref 0.00–0.07)
Basophils Absolute: 0 10*3/uL (ref 0.0–0.1)
Basophils Relative: 1 %
Eosinophils Absolute: 0.3 10*3/uL (ref 0.0–0.5)
Eosinophils Relative: 5 %
HCT: 42.9 % (ref 39.0–52.0)
Hemoglobin: 13.8 g/dL (ref 13.0–17.0)
Immature Granulocytes: 0 %
Lymphocytes Relative: 36 %
Lymphs Abs: 2.1 10*3/uL (ref 0.7–4.0)
MCH: 29.4 pg (ref 26.0–34.0)
MCHC: 32.2 g/dL (ref 30.0–36.0)
MCV: 91.5 fL (ref 80.0–100.0)
Monocytes Absolute: 0.5 10*3/uL (ref 0.1–1.0)
Monocytes Relative: 9 %
Neutro Abs: 2.8 10*3/uL (ref 1.7–7.7)
Neutrophils Relative %: 49 %
Platelet Count: 181 10*3/uL (ref 150–400)
RBC: 4.69 MIL/uL (ref 4.22–5.81)
RDW: 12.7 % (ref 11.5–15.5)
WBC Count: 5.7 10*3/uL (ref 4.0–10.5)
nRBC: 0 % (ref 0.0–0.2)

## 2020-01-02 LAB — T4, FREE: Free T4: 0.76 ng/dL (ref 0.61–1.12)

## 2020-01-02 LAB — TSH: TSH: 7.898 u[IU]/mL — ABNORMAL HIGH (ref 0.320–4.118)

## 2020-01-03 ENCOUNTER — Other Ambulatory Visit: Payer: Self-pay | Admitting: Nurse Practitioner

## 2020-01-03 MED ORDER — GLIPIZIDE 5 MG PO TABS: 5.0000 mg | ORAL_TABLET | Freq: Two times a day (BID) | ORAL | 3 refills | Status: DC

## 2020-01-03 NOTE — Telephone Encounter (Signed)
Switched to glipizide

## 2020-01-05 ENCOUNTER — Other Ambulatory Visit: Payer: Self-pay

## 2020-01-05 ENCOUNTER — Ambulatory Visit: Payer: Self-pay | Attending: Family Medicine | Admitting: Family Medicine

## 2020-01-05 ENCOUNTER — Telehealth: Payer: Self-pay | Admitting: Hematology and Oncology

## 2020-01-05 ENCOUNTER — Encounter: Payer: Self-pay | Admitting: Family Medicine

## 2020-01-05 VITALS — BP 138/88 | HR 63 | Temp 96.8°F | Resp 16 | Wt 307.0 lb

## 2020-01-05 DIAGNOSIS — Z7902 Long term (current) use of antithrombotics/antiplatelets: Secondary | ICD-10-CM

## 2020-01-05 DIAGNOSIS — I824Z1 Acute embolism and thrombosis of unspecified deep veins of right distal lower extremity: Secondary | ICD-10-CM

## 2020-01-05 DIAGNOSIS — Z86711 Personal history of pulmonary embolism: Secondary | ICD-10-CM

## 2020-01-05 DIAGNOSIS — E119 Type 2 diabetes mellitus without complications: Secondary | ICD-10-CM

## 2020-01-05 DIAGNOSIS — R110 Nausea: Secondary | ICD-10-CM

## 2020-01-05 DIAGNOSIS — M5416 Radiculopathy, lumbar region: Secondary | ICD-10-CM

## 2020-01-05 DIAGNOSIS — I10 Essential (primary) hypertension: Secondary | ICD-10-CM

## 2020-01-05 DIAGNOSIS — Z6841 Body Mass Index (BMI) 40.0 and over, adult: Secondary | ICD-10-CM

## 2020-01-05 MED ORDER — PANTOPRAZOLE SODIUM 40 MG PO TBEC: 40.0000 mg | DELAYED_RELEASE_TABLET | Freq: Every day | ORAL | 3 refills | Status: DC

## 2020-01-05 MED ORDER — ONDANSETRON HCL 4 MG PO TABS: 4.0000 mg | ORAL_TABLET | Freq: Three times a day (TID) | ORAL | 0 refills | Status: DC | PRN

## 2020-01-05 MED FILL — ONDANSETRON HCL 4 MG TABLET: 4 | 7 days supply | Qty: 20 | Fill #0

## 2020-01-05 MED FILL — PANTOPRAZOLE SOD DR 40 MG T: 40 | 30 days supply | Qty: 30 | Fill #0

## 2020-01-05 MED FILL — glipiZIDE 5 MG TABS: 5 | 30 days supply | Qty: 60 | Fill #0

## 2020-01-05 NOTE — Progress Notes (Signed)
3 week f/u  Pain- right lower back and goes upward to right shoulder. Side effects  [id number]

## 2020-01-05 NOTE — Progress Notes (Signed)
Established Patient Office Visit  Subjective:  Patient ID: Glenn Conrad, male    DOB: [birth year]  Age: 39 y.o. MRN: [id number]  CC: f/u DVT/PE/HTN-Jrue Yambao Jillyn Hidden, MD  HPI Glenn Conrad, 39 year old male who is status post hospitalization 12/07/2019 at which time he was diagnosed with DVT and pulmonary embolism.  Patient is currently on blood thinning medication.  He did recently see hematology and on review of that note, it appears that he may need long-term anticoagulation secondary to a family history of blood clots.  Patient denies any unusual bleeding since his last visit-no blood in the stool, no black stools, no nosebleeds, no blood in the urine.  He did notice a large bruise last week on his left posterior shoulder without any inciting injury.  At his last visit, his hemoglobin A1c was elevated at 7.3 and patient reports that he did try Metformin but this caused him to have loose stools/diarrhea and he was changed to glipizide.  He reports that his home blood sugars are generally in the 130s or less.  He denies current increased urinary frequency or increased thirst.  He still feels as if he has some issues with stomach upset but this is improving.  He does have some nausea and mid upper abdominal discomfort at times but stools are no longer loose.  Patient also with complaint of recurrent mid to right-sided back pain as well as occasional sharp, shooting pain in his right foot.  Back pain and shooting/sharp pain in the foot tend to occur if he has been sitting and then attempts to stand up and start walking or if he has been standing for a while he will have increased back pain and increased sensation of tingling in the right foot.  He has had no acute chest pain or shortness of breath since his hospitalization and subsequent ED visit on 12/10/2019.  Patient is concerned about his health especially at his young age.  He did meet with the clinical pharmacist and needs to check with the pharmacy today  regarding nicotine patches and nicotine gum as the gum was not available when he recently checked with pharmacy.  Patient would like to completely stop smoking.  Past Medical History:  Diagnosis Date  . Hypertension   . PE (pulmonary thromboembolism) (HCC)   . Thyroid disease     Past Surgical History:  Procedure Laterality Date  . WISDOM TOOTH EXTRACTION      Family History  Problem Relation Age of Onset  . Diabetes Mother   . Hypertension Mother   . Hypertension Father   . Diabetes Father     Social History   Socioeconomic History  . Marital status: Single    Spouse name: Not on file  . Number of children: Not on file  . Years of education: Not on file  . Highest education level: Not on file  Occupational History  . Not on file  Tobacco Use  . Smoking status: Current Every Day Smoker    Packs/day: 0.50  . Smokeless tobacco: Never Used  Substance and Sexual Activity  . Alcohol use: No  . Drug use: No  . Sexual activity: Yes  Other Topics Concern  . Not on file  Social History Narrative  . Not on file   Social Determinants of Health   Financial Resource Strain:   . Difficulty of Paying Living Expenses:   Food Insecurity:   . Worried About Programme researcher, broadcasting/film/video in the Last Year:   . Ran  Out of Food in the Last Year:   Transportation Needs:   . Lack of Transportation (Medical):   Marland Kitchen Lack of Transportation (Non-Medical):   Physical Activity:   . Days of Exercise per Week:   . Minutes of Exercise per Session:   Stress:   . Feeling of Stress :   Social Connections:   . Frequency of Communication with Friends and Family:   . Frequency of Social Gatherings with Friends and Family:   . Attends Religious Services:   . Active Member of Clubs or Organizations:   . Attends Archivist Meetings:   Marland Kitchen Marital Status:   Intimate Partner Violence:   . Fear of Current or Ex-Partner:   . Emotionally Abused:   Marland Kitchen Physically Abused:   . Sexually Abused:      Outpatient Medications Prior to Visit  Medication Sig Dispense Refill  . amLODipine (NORVASC) 5 MG tablet Take 1 tablet (5 mg total) by mouth daily. To lower blood pressure 90 tablet 1  . levothyroxine (SYNTHROID) 88 MCG tablet Take 1 tablet (88 mcg total) by mouth daily. 90 tablet 3  . methocarbamol (ROBAXIN) 500 MG tablet Take 1 tablet (500 mg total) by mouth every 8 (eight) hours as needed for muscle spasms. 60 tablet 3  . nicotine (NICODERM CQ - DOSED IN MG/24 HOURS) 14 mg/24hr patch Place 1 patch (14 mg total) onto the skin daily. 28 patch 1  . RIVAROXABAN (XARELTO) VTE STARTER PACK (15 & 20 MG TABLETS) Follow package directions: Take one 15mg  tablet by mouth twice a day. On day 22 (12/29/2019), switch to one 20mg  tablet once a day. Take with food. 51 each 0  . glipiZIDE (GLUCOTROL) 5 MG tablet Take 1 tablet (5 mg total) by mouth 2 (two) times daily before a meal. (Patient not taking: Reported on 01/05/2020) 60 tablet 3  . nicotine polacrilex (NICORETTE) 4 MG gum Take 1 each (4 mg total) by mouth as needed for smoking cessation. (Patient not taking: Reported on 01/05/2020) 100 tablet 0  . rivaroxaban (XARELTO) 20 MG TABS tablet Take 1 tablet (20 mg total) by mouth daily with breakfast. Please start after you have completed the starter pack. (Patient not taking: Reported on 01/05/2020) 30 tablet 3   No facility-administered medications prior to visit.    Allergies  Allergen Reactions  . Lisinopril     Headaches     ROS Review of Systems  Constitutional: Positive for fatigue (Improved). Negative for chills, diaphoresis and fever.  HENT: Negative for nosebleeds, sore throat and trouble swallowing.   Eyes: Negative for photophobia and visual disturbance.  Respiratory: Negative for cough and shortness of breath.   Cardiovascular: Negative for chest pain and palpitations.  Gastrointestinal: Positive for nausea. Negative for abdominal pain, blood in stool, constipation and diarrhea.   Endocrine: Negative for polydipsia, polyphagia and polyuria.  Genitourinary: Negative for dysuria, frequency and hematuria.  Musculoskeletal: Positive for back pain and gait problem. Negative for neck pain and neck stiffness.  Neurological: Negative for dizziness and headaches.  Hematological: Negative for adenopathy. Bruises/bleeds easily (Noticed recent left posterior shoulder bruise but no abnormal bleeding).  Psychiatric/Behavioral: Negative for self-injury, sleep disturbance and suicidal ideas. The patient is nervous/anxious (About his health).       Objective:    Physical Exam  Constitutional: He is oriented to person, place, and time. He appears well-developed and well-nourished.  Well-nourished well-developed obese male in no acute distress  Neck: No JVD present.  Cardiovascular: Normal rate  and regular rhythm.  Pulmonary/Chest: Effort normal and breath sounds normal.  Abdominal: Soft. There is no abdominal tenderness. There is no rebound and no guarding.  Musculoskeletal:        General: Tenderness present. No edema.     Comments: Lumbosacral and right SI joint tenderness to palpation.  Patient with complaint of right mid back discomfort with seated right leg raise  Lymphadenopathy:    He has no cervical adenopathy.  Neurological: He is alert and oriented to person, place, and time.  Skin: Skin is warm. No rash noted. He is diaphoretic.  Psychiatric: He has a normal mood and affect. Judgment normal.  Initially slightly anxious  Nursing note and vitals reviewed.   BP 138/88   Pulse 63   Temp (!) 96.8 F (36 C)   Resp 16   Wt (!) 307 lb (139.3 kg)   SpO2 97%   BMI 39.42 kg/m  Wt Readings from Last 3 Encounters:  01/05/20 (!) 307 lb (139.3 kg)  01/02/20 (!) 307 lb 1.6 oz (139.3 kg)  12/15/19 (!) 303 lb 9.6 oz (137.7 kg)     Health Maintenance Due  Topic Date Due  . URINE MICROALBUMIN  Never done  . COVID-19 Vaccine (1) Never done  . TETANUS/TDAP  Never done      Lab Results  Component Value Date   TSH 7.898 (H) 01/02/2020   Lab Results  Component Value Date   WBC 5.7 01/02/2020   HGB 13.8 01/02/2020   HCT 42.9 01/02/2020   MCV 91.5 01/02/2020   PLT 181 01/02/2020   Lab Results  Component Value Date   NA 143 01/02/2020   K 4.2 01/02/2020   CO2 31 01/02/2020   GLUCOSE 79 01/02/2020   BUN 12 01/02/2020   CREATININE 1.00 01/02/2020   BILITOT 0.6 01/02/2020   ALKPHOS 70 01/02/2020   AST 20 01/02/2020   ALT 35 01/02/2020   PROT 8.2 (H) 01/02/2020   ALBUMIN 4.0 01/02/2020   CALCIUM 9.8 01/02/2020   ANIONGAP 9 01/02/2020   No results found for: CHOL No results found for: HDL No results found for: LDLCALC No results found for: TRIG No results found for: CHOLHDL Lab Results  Component Value Date   HGBA1C 7.3 (H) 12/15/2019      Assessment & Plan:  1. Acute deep vein thrombosis (DVT) of distal vein of right lower extremity (HCC); history of pulmonary embolism; long-term use of antiplatelet/antithrombotic medication Continue anticoagulant therapy.  Patient does not have any calf tenderness at today's visit but continues to have increased diameter of the right calf versus left.  He has seen hematology since his last visit and though hematology note was not complete, it is suggested in the planned that patient be on long-term anticoagulant therapy.  He has had no new symptoms of leg swelling, shortness of breath or chest pain.  Continue Xarelto use.  Patient with no anemia on most recent CBC.  2. Essential hypertension Blood pressure stable and controlled at today's visit.  Continue amlodipine 5 mg daily.  3. Type 2 diabetes mellitus without complication, without long-term current use of insulin (HCC); morbid obesity Patient reports an intolerance to Metformin as this caused him to have onset of diarrhea.  Patient is currently on glipizide 5 mg twice daily per his report and his current blood sugars are in the 130s or less fasting.   Patient was diagnosed with diabetes at his recent office visit with hemoglobin A1c of 7.3.  4. Lumbar  radiculopathy Patient with complaint of low back pain midline and right lower back and recurrent episodes of shooting/sharp pain and occasional sensation of numbness in the right foot and I discussed with patient that he likely has lumbar radiculopathy/sciatica.  Handout provided on sciatica as part of patient's checkout paperwork and he will be referred to orthopedics for further evaluation and treatment. - AMB referral to orthopedics  5. Nausea Patient with complaint of nausea.  He is currently on Xarelto status post PE/DVT.  Will place patient on Protonix for stomach protection and in case nausea is related to gastritis.  He is made aware that if he has any unusual bruising or bleeding - pantoprazole (PROTONIX) 40 MG tablet; Take 1 tablet (40 mg total) by mouth daily. To reduce stomach acid  Dispense: 90 tablet; Refill: 3 - ondansetron (ZOFRAN) 4 MG tablet; Take 1 tablet (4 mg total) by mouth every 8 (eight) hours as needed for nausea or vomiting.  Dispense: 20 tablet; Refill: 0  6. Encounter for long-term use of antiplatelets/antithrombotics Continue use of Xarelto.  Patient has seen hematology in follow-up since his last visit.  Patient's most recent CBC showed no signs of anemia and patient is aware that if he has bleeding, black stools or any other concerns that he should seek medical follow-up.   An After Visit Summary was printed and given to the patient.   Follow-up: Return in about 3 weeks (around 01/26/2020) for nausea.  Sooner if needed or any concerns  Approximately 35 minutes spent in face-to-face time with the patient at today's visit with additional 14 minutes for review of chart, completion of today's visit note.  Cain Saupe, MD

## 2020-01-05 NOTE — Patient Instructions (Signed)
Sciatica  Sciatica is pain, weakness, tingling, or loss of feeling (numbness) along the sciatic nerve. The sciatic nerve starts in the lower back and goes down the back of each leg. Sciatica usually goes away on its own or with treatment. Sometimes, sciatica may come back (recur). What are the causes? This condition happens when the sciatic nerve is pinched or has pressure put on it. This may be the result of:  A disk in between the bones of the spine bulging out too far (herniated disk).  Changes in the spinal disks that occur with aging.  A condition that affects a muscle in the butt.  Extra bone growth near the sciatic nerve.  A break (fracture) of the area between your hip bones (pelvis).  Pregnancy.  Tumor. This is rare. What increases the risk? You are more likely to develop this condition if you:  Play sports that put pressure or stress on the spine.  Have poor strength and ease of movement (flexibility).  Have had a back injury in the past.  Have had back surgery.  Sit for long periods of time.  Do activities that involve bending or lifting over and over again.  Are very overweight (obese). What are the signs or symptoms? Symptoms can vary from mild to very bad. They may include:  Any of these problems in the lower back, leg, hip, or butt: ? Mild tingling, loss of feeling, or dull aches. ? Burning sensations. ? Sharp pains.  Loss of feeling in the back of the calf or the sole of the foot.  Leg weakness.  Very bad back pain that makes it hard to move. These symptoms may get worse when you cough, sneeze, or laugh. They may also get worse when you sit or stand for long periods of time. How is this treated? This condition often gets better without any treatment. However, treatment may include:  Changing or cutting back on physical activity when you have pain.  Doing exercises and stretching.  Putting ice or heat on the affected area.  Medicines that  help: ? To relieve pain and swelling. ? To relax your muscles.  Shots (injections) of medicines that help to relieve pain, irritation, and swelling.  Surgery. Follow these instructions at home: Medicines  Take over-the-counter and prescription medicines only as told by your doctor.  Ask your doctor if the medicine prescribed to you: ? Requires you to avoid driving or using heavy machinery. ? Can cause trouble pooping (constipation). You may need to take these steps to prevent or treat trouble pooping:  Drink enough fluids to keep your pee (urine) pale yellow.  Take over-the-counter or prescription medicines.  Eat foods that are high in fiber. These include beans, whole grains, and fresh fruits and vegetables.  Limit foods that are high in fat and sugar. These include fried or sweet foods. Managing pain      If told, put ice on the affected area. ? Put ice in a plastic bag. ? Place a towel between your skin and the bag. ? Leave the ice on for 20 minutes, 2-3 times a day.  If told, put heat on the affected area. Use the heat source that your doctor tells you to use, such as a moist heat pack or a heating pad. ? Place a towel between your skin and the heat source. ? Leave the heat on for 20-30 minutes. ? Remove the heat if your skin turns bright red. This is very important if you are   unable to feel pain, heat, or cold. You may have a greater risk of getting burned. Activity   Return to your normal activities as told by your doctor. Ask your doctor what activities are safe for you.  Avoid activities that make your symptoms worse.  Take short rests during the day. ? When you rest for a long time, do some physical activity or stretching between periods of rest. ? Avoid sitting for a long time without moving. Get up and move around at least one time each hour.  Exercise and stretch regularly, as told by your doctor.  Do not lift anything that is heavier than 10 lb (4.5 kg)  while you have symptoms of sciatica. ? Avoid lifting heavy things even when you do not have symptoms. ? Avoid lifting heavy things over and over.  When you lift objects, always lift in a way that is safe for your body. To do this, you should: ? Bend your knees. ? Keep the object close to your body. ? Avoid twisting. General instructions  Stay at a healthy weight.  Wear comfortable shoes that support your feet. Avoid wearing high heels.  Avoid sleeping on a mattress that is too soft or too hard. You might have less pain if you sleep on a mattress that is firm enough to support your back.  Keep all follow-up visits as told by your doctor. This is important. Contact a doctor if:  You have pain that: ? Wakes you up when you are sleeping. ? Gets worse when you lie down. ? Is worse than the pain you have had in the past. ? Lasts longer than 4 weeks.  You lose weight without trying. Get help right away if:  You cannot control when you pee (urinate) or poop (have a bowel movement).  You have weakness in any of these areas and it gets worse: ? Lower back. ? The area between your hip bones. ? Butt. ? Legs.  You have redness or swelling of your back.  You have a burning feeling when you pee. Summary  Sciatica is pain, weakness, tingling, or loss of feeling (numbness) along the sciatic nerve.  This condition happens when the sciatic nerve is pinched or has pressure put on it.  Sciatica can cause pain, tingling, or loss of feeling (numbness) in the lower back, legs, hips, and butt.  Treatment often includes rest, exercise, medicines, and putting ice or heat on the affected area. This information is not intended to replace advice given to you by your health care provider. Make sure you discuss any questions you have with your health care provider. Document Revised: 08/19/2018 Document Reviewed: 08/19/2018 Elsevier Patient Education  2020 [employer]  

## 2020-01-05 NOTE — Telephone Encounter (Signed)
Scheduled per los. Called and spoke with patient. Confirmed appt 

## 2020-01-06 ENCOUNTER — Encounter: Payer: Self-pay | Admitting: Hematology and Oncology

## 2020-01-08 ENCOUNTER — Telehealth: Payer: Self-pay | Admitting: *Deleted

## 2020-01-08 NOTE — Telephone Encounter (Signed)
-----   Message from Jaci Standard, MD sent at 01/06/2020  2:37 PM EDT ----- Please call Glenn Conrad to let him know his labs look good. Blood counts/kidney function are fine. No clear explanation for his sweats and other symptoms. He should have his PCP look into alternatives to metformin, hopefully this was already done. We will see him in October to see how he is doing on anticoagulation therapy.   Azucena Freed  ----- Message ----- From: Leory Plowman, Lab [facility] [place] Sent: 01/02/2020  10:52 AM EDT To: Jaci Standard, MD

## 2020-01-08 NOTE — Telephone Encounter (Signed)
TCT patient regarding lab results. Spoke with patient and advised that all his labs came back normal. No clear explanation for the night sweats.  Pt states he is getting a different prescription for his diabetes filled today-Glipizide.Marland Kitchen He voiced understanding for lab results. He is aware of his appt here in October 2021

## 2020-01-08 NOTE — Telephone Encounter (Signed)
CMA spoke to patient and informed on the medication changes. Pt. Understood.

## 2020-01-09 ENCOUNTER — Ambulatory Visit: Payer: Self-pay | Admitting: Nurse Practitioner

## 2020-01-14 ENCOUNTER — Ambulatory Visit: Payer: Self-pay | Attending: Nurse Practitioner | Admitting: Nurse Practitioner

## 2020-01-14 ENCOUNTER — Encounter: Payer: Self-pay | Admitting: Nurse Practitioner

## 2020-01-14 ENCOUNTER — Other Ambulatory Visit: Payer: Self-pay

## 2020-01-14 DIAGNOSIS — Z6841 Body Mass Index (BMI) 40.0 and over, adult: Secondary | ICD-10-CM

## 2020-01-14 DIAGNOSIS — E039 Hypothyroidism, unspecified: Secondary | ICD-10-CM

## 2020-01-14 DIAGNOSIS — R110 Nausea: Secondary | ICD-10-CM

## 2020-01-14 DIAGNOSIS — E1165 Type 2 diabetes mellitus with hyperglycemia: Secondary | ICD-10-CM

## 2020-01-14 DIAGNOSIS — F17200 Nicotine dependence, unspecified, uncomplicated: Secondary | ICD-10-CM

## 2020-01-14 MED ORDER — TRUE METRIX BLOOD GLUCOSE TEST VI STRP: ORAL_STRIP | 12 refills | Status: DC

## 2020-01-14 MED ORDER — EMPAGLIFLOZIN 10 MG PO TABS: 10.0000 mg | ORAL_TABLET | Freq: Every day | ORAL | 0 refills | Status: AC

## 2020-01-14 MED ORDER — PANTOPRAZOLE SODIUM 40 MG PO TBEC: 40.0000 mg | DELAYED_RELEASE_TABLET | Freq: Every day | ORAL | 3 refills | Status: DC

## 2020-01-14 MED ORDER — TRUEPLUS LANCETS 28G MISC: 3 refills | Status: DC

## 2020-01-14 MED ORDER — TRUE METRIX METER W/DEVICE KIT: PACK | 0 refills | Status: AC

## 2020-01-14 MED ORDER — NICOTINE POLACRILEX 4 MG MT GUM: 4.0000 mg | CHEWING_GUM | OROMUCOSAL | 0 refills | Status: DC | PRN

## 2020-01-14 MED FILL — TRUEplus LANCETS 28G MISC: 50 days supply | Qty: 100 | Fill #0

## 2020-01-14 MED FILL — JARDIANCE 10 MG TABLET: 10 | 14 days supply | Qty: 14 | Fill #0

## 2020-01-14 MED FILL — TRUE METRIX TEST STRIP: 50 days supply | Qty: 100 | Fill #0

## 2020-01-14 MED FILL — !TRUE METRIX BLOOD GLUCOSE: 365 days supply | Qty: 1 | Fill #0

## 2020-01-14 NOTE — Progress Notes (Signed)
Virtual Visit via Telephone Note Due to national recommendations of social distancing due to [place] 19, telehealth visit is felt to be most appropriate for this patient at this time.  I discussed the limitations, risks, security and privacy concerns of performing an evaluation and management service by telephone and the availability of in person appointments. I also discussed with the patient that there may be a patient responsible charge related to this service. The patient expressed understanding and agreed to proceed.    I connected with Glenn Conrad on 01/14/20  at   1:30 PM EDT  EDT by telephone and verified that I am speaking with the correct person using two identifiers.   Consent I discussed the limitations, risks, security and privacy concerns of performing an evaluation and management service by telephone and the availability of in person appointments. I also discussed with the patient that there may be a patient responsible charge related to this service. The patient expressed understanding and agreed to proceed.   Location of Patient: Private Residence    Location of Provider: [place] and [place] participating in Telemedicine visit: Geryl Rankins FNP-BC [place]    History of Present Illness: Telemedicine visit for: Diabetes  States the glipizide he is currently taking is causing diarrhea like the metformin he had previously been prescribed.  Is interested in taking Jardiance which I will send to the pharmacy today 10 mg.  He has not been monitoring his blood glucose levels as he states he does not have a device and I will have that sent to the pharmacy as well today.  He will need a lipid panel as there is not one available for review on his chart. Lab Results  Component Value Date   HGBA1C 7.3 (H) 12/15/2019   He is very upset that his visit today was through teleconference.  States he forgets what he has to say when he  has a telephone call versus when he is in the office.  I have instructed him in the future to keep a list of his concerns and that way whether it is a tele visit or in the office we will be able to hopefully address those.   Hypothyroidism He will need to return to the lab for repeat TSH at the end of June early July.  He is aware that he should have lab work performed when he sees our pharmacist on June 30. Lab Results  Component Value Date   TSH 7.898 (H) 01/02/2020    Past Medical History:  Diagnosis Date   Hypertension    PE (pulmonary thromboembolism) ([place])    Thyroid disease     Past Surgical History:  Procedure Laterality Date   WISDOM TOOTH EXTRACTION      Family History  Problem Relation Age of Onset   Diabetes Mother    Hypertension Mother    Hypertension Father    Diabetes Father     Social History   Socioeconomic History   Marital status: Single    Spouse name: Not on file   Number of children: Not on file   Years of education: Not on file   Highest education level: Not on file  Occupational History   Not on file  Tobacco Use   Smoking status: Current Every Day Smoker    Packs/day: 0.50   Smokeless tobacco: Never Used  Substance and Sexual Activity   Alcohol use: No   Drug use: No  Sexual activity: Yes  Other Topics Concern   Not on file  Social History Narrative   Not on file   Social Determinants of Health   Financial Resource Strain:    Difficulty of Paying Living Expenses:   Food Insecurity:    Worried About Charity fundraiser in the Last Year:    Arboriculturist in the Last Year:   Transportation Needs:    Film/video editor (Medical):    Lack of Transportation (Non-Medical):   Physical Activity:    Days of Exercise per Week:    Minutes of Exercise per Session:   Stress:    Feeling of Stress :   Social Connections:    Frequency of Communication with Friends and Family:    Frequency of Social  Gatherings with Friends and Family:    Attends Religious Services:    Active Member of Clubs or Organizations:    Attends Music therapist:    Marital Status:      Observations/Objective: Awake, alert and oriented x 3   Review of Systems  Constitutional: Negative for fever, malaise/fatigue and weight loss.  HENT: Negative.  Negative for nosebleeds.   Eyes: Negative.  Negative for blurred vision, double vision and photophobia.  Respiratory: Negative.  Negative for cough and shortness of breath.   Cardiovascular: Negative.  Negative for chest pain, palpitations and leg swelling.  Gastrointestinal: Positive for diarrhea. Negative for abdominal pain, blood in stool, constipation, heartburn, melena, nausea and vomiting.  Musculoskeletal: Negative.  Negative for myalgias.  Neurological: Negative.  Negative for dizziness, focal weakness, seizures and headaches.  Psychiatric/Behavioral: Negative.  Negative for suicidal ideas.    Assessment and Plan: Glenn Conrad was seen today for medication management.  Diagnoses and all orders for this visit:  Type 2 diabetes mellitus with hyperglycemia, without long-term current use of insulin (HCC) -     Blood Glucose Monitoring Suppl (TRUE METRIX METER) w/Device KIT; Use as instructed. Check blood glucose level by fingerstick twice per day.  E11.65 -     glucose blood (TRUE METRIX BLOOD GLUCOSE TEST) test strip; Use as instructed. Check blood glucose level by fingerstick twice per day. -     TRUEplus Lancets 28G MISC; Use as instructed. Check blood glucose level by fingerstick twice per day. -     empagliflozin (JARDIANCE) 10 MG TABS tablet; Take 1 tablet (10 mg total) by mouth daily before breakfast. -     Lipid panel; Future Continue blood sugar control as discussed in office today, low carbohydrate diet, and regular physical exercise as tolerated, 150 minutes per week (30 min each day, 5 days per week, or 50 min 3 days per week). Keep  blood sugar logs with fasting goal of 90-130 mg/dl, post prandial (after you eat) less than 180.  For Hypoglycemia: BS <60 and Hyperglycemia BS >400; contact the clinic ASAP. Annual eye exams and foot exams are recommended.   Morbid obesity with BMI of 40.0-44.9, adult ([place]) -     Lipid panel; Future Discussed diet and exercise for person with BMI >40. Instructed: You must burn more calories than you eat. Losing 5 percent of your body weight should be considered a success. In the longer term, losing more than 15 percent of your body weight and staying at this weight is an extremely good result. However, keep in mind that even losing 5 percent of your body weight leads to important health benefits, so try not to get discouraged if you're not  able to lose more than this. Will recheck weight in 3-6 months.  Hypothyroidism, unspecified type -     TSH; Future  Nausea -     pantoprazole (PROTONIX) 40 MG tablet; Take 1 tablet (40 mg total) by mouth daily. To reduce stomach acid  Current smoker -     nicotine polacrilex (NICORETTE) 4 MG gum; Take 1 each (4 mg total) by mouth as needed for smoking cessation. He is aware of the risks of smoking. Trying to quit.      Follow Up Instructions Return in about 2 months (around 03/15/2020).     I discussed the assessment and treatment plan with the patient. The patient was provided an opportunity to ask questions and all were answered. The patient agreed with the plan and demonstrated an understanding of the instructions.   The patient was advised to call back or seek an in-person evaluation if the symptoms worsen or if the condition fails to improve as anticipated.  I provided 17 minutes of non-face-to-face time during this encounter including median intraservice time, reviewing previous notes, labs, imaging, medications and explaining diagnosis and management.  [name], FNP-BC

## 2020-01-26 ENCOUNTER — Ambulatory Visit: Payer: Self-pay | Admitting: Family Medicine

## 2020-01-26 MED FILL — LEVOTHYROXINE 88 MCG TABLET: 88 | 30 days supply | Qty: 30 | Fill #1

## 2020-01-26 MED FILL — METHOCARBAMOL 500 MG TABS: 500 | 20 days supply | Qty: 60 | Fill #1

## 2020-01-26 MED FILL — XARELTO 20 MG TABLET: 20 | 30 days supply | Qty: 30 | Fill #1

## 2020-01-26 MED FILL — AMLODIPINE BESYLATE 5 MG TA: 5 | 30 days supply | Qty: 30 | Fill #1

## 2020-02-03 MED FILL — JARDIANCE 10 MG TABLET: 10 | 30 days supply | Qty: 30 | Fill #1

## 2020-02-11 ENCOUNTER — Other Ambulatory Visit: Payer: Self-pay

## 2020-02-11 ENCOUNTER — Encounter: Payer: Self-pay | Admitting: Pharmacist

## 2020-02-11 ENCOUNTER — Ambulatory Visit: Payer: Self-pay | Attending: Nurse Practitioner | Admitting: Pharmacist

## 2020-02-11 VITALS — BP 123/75

## 2020-02-11 DIAGNOSIS — E1165 Type 2 diabetes mellitus with hyperglycemia: Secondary | ICD-10-CM

## 2020-02-11 MED ORDER — CHANTIX STARTING MONTH PAK 0.5 MG X 11 & 1 MG X 42 PO TABS: ORAL_TABLET | ORAL | 0 refills | Status: DC

## 2020-02-11 MED FILL — CHANTIX STARTING MONTH BOX: 0.5 MG X 11 | 30 days supply | Qty: 53 | Fill #0

## 2020-02-11 NOTE — Progress Notes (Signed)
    S:     No chief complaint on file.  Patient arrives in good spirits.  Presents for diabetes evaluation, education, and management Patient was referred and last seen by Primary Care Provider on 01/14/2020. Jardiance was added at that visit. Glipizide was stopped at that appointment d/t diarrhea/intolerance. Additionally, he has an intolerance to metformin (diarrhea).    Patient reports Diabetes was diagnosed in May, 2021.   Family/Social History:  - FHX: HTN, DM - Tobacco: current 4 cigarettes/day smoker - Alcohol: denies use   Insurance coverage/medication affordability: no rx insurance   Medication adherence reported.   Current diabetes medications include: Jardiance 10 mg daily, Current hypertension medications include: amlodipine 5 mg daily Current hyperlipidemia medications include: none   Patient denies hypoglycemic events.  Patient reported dietary habits:  - Has switched to Gatorade Zero - Increased water intake - Reports eliminating carbs and sugars from his diet   Patient-reported exercise habits:  - He stays active at work    Patient denies nocturia (nighttime urination).  Patient denies neuropathy (nerve pain). Patient denies visual changes. Patient reports self foot exams.     O:  POCT: 105  Lab Results  Component Value Date   HGBA1C 7.3 (H) 12/15/2019   There were no vitals filed for this visit.  Lipid Panel  No results found for: CHOL, TRIG, HDL, CHOLHDL, VLDL, LDLCALC, LDLDIRECT  Home fasting blood sugars: 75 - 109   Clinical Atherosclerotic Cardiovascular Disease (ASCVD): No  The ASCVD Risk score Denman George DC Jr., et al., 2013) failed to calculate for the following reasons:   Cannot find a previous HDL lab   Cannot find a previous total cholesterol lab    A/P: Diabetes newly dx currently close to goal. Home CBGs are at goal to low. Patient is able to verbalize appropriate hypoglycemia management plan. Medication adherence reported.  -Continue  current regimen. -Extensively discussed pathophysiology of diabetes, recommended lifestyle interventions, dietary effects on blood sugar control -Counseled on s/sx of and management of hypoglycemia -Next A1C anticipated 03/2020.   ASCVD risk - primary prevention in patient with diabetes. Needs lipid panel; recommend at least moderate intensity statin therapy. May need to initiate with high intensity pending results of lipid panel and estimated ASCVD 10-yr risk. LFTs from recent CMP WNL. -Lipid -Recommend initiation of statin therapy at tomorrow's visit with NP  HM: UTD on PNA. Needs tetanus and covid vaccines. Offered tetanus in clinic today. - Pt defers until seeing the provider tomorrow.   Smoking cessation: Pt reports minimal results with NRT and is interested in Chantix. Denies hx of depression, abnormal thoughts, seizures, or abnormal dreams.  -Stop patch.  -Continue nicotine gum for breakthrough.  -Start Chantix 0.5 mg daily x3 days, 0.5 mg BID x4 days, the 1 mg BID thereafter.   Written patient instructions provided.  Total time in face to face counseling 15 minutes.   Follow up with NP Clinic Visit tomorrow.    Butch Penny, PharmD, CPP Clinical Pharmacist [facility] [phone]

## 2020-02-11 NOTE — Progress Notes (Signed)
Patient ID: Glenn Conrad, male    DOB: [birth year]  MRN: [id number]  CC: DVT Follow-Up   Subjective: Glenn Conrad is a 39 y.o. male with history of acute pulmonary embolus, hypertension, and current smoker who presents for DVT follow-up.   1. DVT FOLLOW-UP:  Last visit 01/02/2020 with Hematology/Oncology Dr. Lorenso Courier. During that encounter it was determined that more than likely indefinite anticoagulation would be needed to prevent future incidents of VTE. This would be reconsidered in 6 months from initial dose of anticoagulation therapy in order to discuss decreased to a maintenance dose if his symptoms have resolved. Patient recommended to follow-up in October 2021 to discuss continued anticoagulation therapy with consideration to maintenance dose.    Last visit 5/42/2021 with Dr. Chapman Fitch. During that encounter patient on anticoagulant therapy.   Today patient reports he notices his right calf is bigger than left leg calf.  States that he really doesn't feel as if the right lower leg is swelling it just seems bigger. Denies pain, redness, warmth of right lower extremity. Denies shortness of breath and chest pain. Denies dark and/or bloody stools. Reports has numbness sometimes of right lower extremity. Sometimes feels as if shoe is too tight. Reports if he has to drive more than 1 hour then he has to wear flip flops. Reports he is still taking Xarelto as prescribed. Reports he has an appointment scheduled with Hematology/Oncology in October 2021. Reports he saw Hematology/Oncology on last month and he was told that he would need to remain on blood thinners for at least 6 more months and then he would be reevaluated to see if he should continue on this medication.   Patient Active Problem List   Diagnosis Date Noted  . Acute pulmonary embolus ([place]) 12/07/2019  . Hypertension   . Current smoker      Current Outpatient Medications on File Prior to Visit  Medication Sig Dispense Refill  .  amLODipine (NORVASC) 5 MG tablet Take 1 tablet (5 mg total) by mouth daily. To lower blood pressure 90 tablet 1  . Blood Glucose Monitoring Suppl (TRUE METRIX METER) w/Device KIT Use as instructed. Check blood glucose level by fingerstick twice per day.  E11.65 1 kit 0  . empagliflozin (JARDIANCE) 10 MG TABS tablet Take 1 tablet (10 mg total) by mouth daily before breakfast. 90 tablet 0  . glucose blood (TRUE METRIX BLOOD GLUCOSE TEST) test strip Use as instructed. Check blood glucose level by fingerstick twice per day. 100 each 12  . levothyroxine (SYNTHROID) 88 MCG tablet Take 1 tablet (88 mcg total) by mouth daily. 90 tablet 3  . methocarbamol (ROBAXIN) 500 MG tablet Take 1 tablet (500 mg total) by mouth every 8 (eight) hours as needed for muscle spasms. 60 tablet 3  . nicotine (NICODERM CQ - DOSED IN MG/24 HOURS) 14 mg/24hr patch Place 1 patch (14 mg total) onto the skin daily. 28 patch 1  . nicotine polacrilex (NICORETTE) 4 MG gum Take 1 each (4 mg total) by mouth as needed for smoking cessation. 100 tablet 0  . ondansetron (ZOFRAN) 4 MG tablet Take 1 tablet (4 mg total) by mouth every 8 (eight) hours as needed for nausea or vomiting. 20 tablet 0  . pantoprazole (PROTONIX) 40 MG tablet Take 1 tablet (40 mg total) by mouth daily. To reduce stomach acid 90 tablet 3  . rivaroxaban (XARELTO) 20 MG TABS tablet Take 1 tablet (20 mg total) by mouth daily with breakfast. Please start after you  have completed the starter pack. 30 tablet 3  . TRUEplus Lancets 28G MISC Use as instructed. Check blood glucose level by fingerstick twice per day. 100 each 3   No current facility-administered medications on file prior to visit.    Allergies  Allergen Reactions  . Lisinopril     Headaches     Social History   Socioeconomic History  . Marital status: Single    Spouse name: Not on file  . Number of children: Not on file  . Years of education: Not on file  . Highest education level: Not on file    Occupational History  . Not on file  Tobacco Use  . Smoking status: Current Every Day Smoker    Packs/day: 0.50  . Smokeless tobacco: Never Used  Vaping Use  . Vaping Use: Never used  Substance and Sexual Activity  . Alcohol use: No  . Drug use: No  . Sexual activity: Yes  Other Topics Concern  . Not on file  Social History Narrative  . Not on file   Social Determinants of Health   Financial Resource Strain:   . Difficulty of Paying Living Expenses:   Food Insecurity:   . Worried About Charity fundraiser in the Last Year:   . Arboriculturist in the Last Year:   Transportation Needs:   . Film/video editor (Medical):   Marland Kitchen Lack of Transportation (Non-Medical):   Physical Activity:   . Days of Exercise per Week:   . Minutes of Exercise per Session:   Stress:   . Feeling of Stress :   Social Connections:   . Frequency of Communication with Friends and Family:   . Frequency of Social Gatherings with Friends and Family:   . Attends Religious Services:   . Active Member of Clubs or Organizations:   . Attends Archivist Meetings:   Marland Kitchen Marital Status:   Intimate Partner Violence:   . Fear of Current or Ex-Partner:   . Emotionally Abused:   Marland Kitchen Physically Abused:   . Sexually Abused:     Family History  Problem Relation Age of Onset  . Diabetes Mother   . Hypertension Mother   . Hypertension Father   . Diabetes Father     Past Surgical History:  Procedure Laterality Date  . WISDOM TOOTH EXTRACTION      ROS: Review of Systems Negative except as stated above  PHYSICAL EXAM: Vitals with BMI 02/12/2020 02/11/2020 01/05/2020  Height - - -  Weight 301 lbs 13 oz - -  BMI - - -  Systolic 161 096 045  Diastolic 80 75 88  Pulse 58 - 63  SpO2- 98%, room air  Temperature- 97.9F, oral  Physical Exam General appearance - alert, well appearing, and in no distress, oriented to person, place, and time and overweight,  Mental status - alert, oriented to  person, place, and time, normal mood, behavior, speech, dress, motor activity, and thought processes, slightly anxious Neck - supple, no significant adenopathy Lymphatics - no palpable lymphadenopathy, no hepatosplenomegaly Chest - clear to auscultation, no wheezes, rales or rhonchi, symmetric air entry, no tachypnea, retractions or cyanosis Heart - normal rate, regular rhythm, normal S1, S2, no murmurs, rubs, clicks or gallops Back exam - Lumbosacral and right SI joint tenderness to palpation. Patient with complaint of right mid back discomfort with seated right leg raise, no edema Neurological - alert, oriented, normal speech, no focal findings or movement disorder noted, neck  supple without rigidity, cranial nerves II through XII intact, DTR's normal and symmetric, motor and sensory grossly normal bilaterally, normal muscle tone, no tremors, strength 5/5 Musculoskeletal - tenderness present at right lower back without edema  Extremities - peripheral pulses normal, no pedal edema, no clubbing or cyanosis  Skin - normal coloration and turgor, no rashes, no suspicious skin lesions noted  Results for orders placed or performed in visit on 02/12/20  POCT glucose (manual entry)  Result Value Ref Range   POC Glucose 95 70 - 99 mg/dl    ASSESSMENT AND PLAN: 1. Acute deep vein thrombosis (DVT) of distal vein of right lower extremity ([place]); history of pulmonary embolism; long term current use of antithrombotics/antiplatelets: -Patient with history of right lower extremity DVT and unprovoked pulmonary embolism. -Continue Rivaroxaban as prescribed for management of DVT and unprovoked pulmonary embolism. -Patient without calf tenderness, redness, and swelling during today's visit. -Patient without shortness of breath and chest pain during today's visit. -Patient with continued concern of increased diameter of right calf versus left.  -CBC on 01/02/2020 without anemia. -Last visit 01/02/2020 with  Hematology/Oncology. During that encounter it was determined that more than likely indefinite anticoagulation would be needed to prevent future incidents of VTE. This would be reconsidered in 6 months from initial dose of anticoagulation therapy in order to discuss decreased to a maintenance dose if his symptoms have resolved. Patient scheduled to follow-up in October 2021 to discuss continued anticoagulation therapy with consideration to maintenance dose.  -Keep appointment with Hematology/Oncology for management of DVT and unprovoked pulmonary embolism.  2. Type 2 diabetes mellitus with hyperglycemia, without long-term current use of insulin ([place]): -CBG normal during today's visit.  -Last appointment for diabetes with nurse practitioner Raul Del on 01/14/2020. Scheduled for follow-up in regards to diabetes on 03/16/2020. Continue current plan of care until that time. - POCT glucose (manual entry)  3. Lumbar radiculopathy: -Patient with complaint of low back pain midline and right lower back pain with occasional sharp pain with sensation of numbness in right foot. This seems likely related to lumbar radiculopathy/sciatica. -Continue Methocarbamol as prescribed for muscle spasms and pain of back.  -Continue Tylenol for back pain over-the-counter as needed. -Patient referred to Orthopedics on 01/05/2020 for further evaluation and management by his primary physician. Patient reports he has not been able to make it to appointment as he is still pending processing of [place] discount/orange card.  Patient was given the opportunity to ask questions.  Patient verbalized understanding of the plan and was able to repeat key elements of the plan.    Camillia Herter, NP

## 2020-02-12 ENCOUNTER — Encounter: Payer: Self-pay | Admitting: Family

## 2020-02-12 ENCOUNTER — Ambulatory Visit (HOSPITAL_BASED_OUTPATIENT_CLINIC_OR_DEPARTMENT_OTHER): Payer: Self-pay | Admitting: Family

## 2020-02-12 ENCOUNTER — Ambulatory Visit: Payer: Self-pay | Attending: Nurse Practitioner

## 2020-02-12 VITALS — BP 122/80 | HR 58 | Temp 97.2°F | Resp 16 | Wt 301.8 lb

## 2020-02-12 DIAGNOSIS — E1165 Type 2 diabetes mellitus with hyperglycemia: Secondary | ICD-10-CM

## 2020-02-12 DIAGNOSIS — I824Z1 Acute embolism and thrombosis of unspecified deep veins of right distal lower extremity: Secondary | ICD-10-CM

## 2020-02-12 DIAGNOSIS — Z86711 Personal history of pulmonary embolism: Secondary | ICD-10-CM

## 2020-02-12 DIAGNOSIS — Z7902 Long term (current) use of antithrombotics/antiplatelets: Secondary | ICD-10-CM

## 2020-02-12 DIAGNOSIS — M5416 Radiculopathy, lumbar region: Secondary | ICD-10-CM

## 2020-02-12 LAB — LIPID PANEL
Chol/HDL Ratio: 4.1 ratio (ref 0.0–5.0)
Cholesterol, Total: 155 mg/dL (ref 100–199)
HDL: 38 mg/dL — ABNORMAL LOW (ref >39)
LDL Chol Calc (NIH): 92 mg/dL (ref 0–99)
Triglycerides: 138 mg/dL (ref 0–149)
VLDL Cholesterol Cal: 25 mg/dL (ref 5–40)

## 2020-02-12 LAB — MICROALBUMIN / CREATININE URINE RATIO
Creatinine, Urine: 107.4 mg/dL
Microalb/Creat Ratio: <3 mg/g creat (ref 0–29)
Microalbumin, Urine: <3 ug/mL

## 2020-02-12 LAB — GLUCOSE, POCT (MANUAL RESULT ENTRY): POC Glucose: 95 mg/dl (ref 70–99)

## 2020-02-12 NOTE — Patient Instructions (Addendum)
Continue Xarelto as prescribed for blood clot.   [place] financial discount/orange card pending approval.   Referral to Orthopedics for back pain pending approval of financial discount.   Continue Tylenol and Robaxin as needed for back pain.   Counseled patient to not operate heavy machinery, drive a vehicle, or drink alcohol in combination with Robaxin muscle relaxant.   Keep appointment with nurse practitioner Meredeth Ide scheduled 03/16/2020 at 1:30 pm.   Follow-up with primary provider as needed.   Keep appointment with Hematology.  Deep Vein Thrombosis  Deep vein thrombosis (DVT) is a condition in which a blood clot forms in a deep vein, such as a lower leg, thigh, or arm vein. A clot is blood that has thickened into a gel or solid. This condition is dangerous. It can lead to serious and even life-threatening complications if the clot travels to the lungs and causes a blockage (pulmonary embolism). It can also damage veins in the leg. This can result in leg pain, swelling, discoloration, and sores (post-thrombotic syndrome). What are the causes? This condition may be caused by:  A slowdown of blood flow.  Damage to a vein.  A condition that causes blood to clot more easily, such as an inherited clotting disorder. What increases the risk? The following factors may make you more likely to develop this condition:  Being overweight.  Being older, especially over age 45.  Sitting or lying down for more than four hours.  Being in the hospital.  Lack of physical activity (sedentary lifestyle).  Pregnancy, being in childbirth, or having recently given birth.  Taking medicines that contain estrogen, such as medicines to prevent pregnancy.  Smoking.  A history of any of the following: ? Blood clots or a blood clotting disease. ? Peripheral vascular disease. ? Inflammatory bowel disease. ? Cancer. ? Heart disease. ? Genetic conditions that affect how your blood clots,  such as Factor V Leiden mutation. ? Neurological diseases that affect your legs (leg paresis). ? A recent injury, such as a car accident. ? Major or lengthy surgery. ? A central line placed inside a large vein. What are the signs or symptoms? Symptoms of this condition include:  Swelling, pain, or tenderness in an arm or leg.  Warmth, redness, or discoloration in an arm or leg. If the clot is in your leg, symptoms may be more noticeable or worse when you stand or walk. Some people may not develop any symptoms. How is this diagnosed? This condition is diagnosed with:  A medical history and physical exam.  Tests, such as: ? Blood tests. These are done to check how well your blood clots. ? Ultrasound. This is done to check for clots. ? Venogram. For this test, contrast dye is injected into a vein and X-rays are taken to check for any clots. How is this treated? Treatment for this condition depends on:  The cause of your DVT.  Your risk for bleeding or developing more clots.  Any other medical conditions that you have. Treatment may include:  Taking a blood thinner (anticoagulant). This type of medicine prevents clots from forming. It may be taken by mouth, injected under the skin, or injected through an IV (catheter).  Injecting clot-dissolving medicines into the affected vein (catheter-directed thrombolysis).  Having surgery. Surgery may be done to: ? Remove the clot. ? Place a filter in a large vein to catch blood clots before they reach the lungs. Some treatments may be continued for up to six months. Follow  these instructions at home: If you are taking blood thinners:  Take the medicine exactly as told by your health care provider. Some blood thinners need to be taken at the same time every day. Do not skip a dose.  Talk with your health care provider before you take any medicines that contain aspirin or NSAIDs. These medicines increase your risk for dangerous  bleeding.  Ask your health care provider about foods and drugs that could change the way the medicine works (may interact). Avoid those things if your health care provider tells you to do so.  Blood thinners can cause easy bruising and may make it difficult to stop bleeding. Because of this: ? Be very careful when using knives, scissors, or other sharp objects. ? Use an electric razor instead of a blade. ? Avoid activities that could cause injury or bruising, and follow instructions about how to prevent falls.  Wear a medical alert bracelet or carry a card that lists what medicines you take. General instructions  Take over-the-counter and prescription medicines only as told by your health care provider.  Return to your normal activities as told by your health care provider. Ask your health care provider what activities are safe for you.  Wear compression stockings if recommended by your health care provider.  Keep all follow-up visits as told by your health care provider. This is important. How is this prevented? To lower your risk of developing this condition again:  For 30 or more minutes every day, do an activity that: ? Involves moving your arms and legs. ? Increases your heart rate.  When traveling for longer than four hours: ? Exercise your arms and legs every hour. ? Drink plenty of water. ? Avoid drinking alcohol.  Avoid sitting or lying for a long time without moving your legs.  If you have surgery or you are hospitalized, ask about ways to prevent blood clots. These may include taking frequent walks or using anticoagulants.  Stay at a healthy weight.  If you are a woman who is older than age 75, avoid unnecessary use of medicines that contain estrogen, such as some birth control pills.  Do not use any products that contain nicotine or tobacco, such as cigarettes and e-cigarettes. This is especially important if you take estrogen medicines. If you need help quitting,  ask your health care provider. Contact a health care provider if:  You miss a dose of your blood thinner.  Your menstrual period is heavier than usual.  You have unusual bruising. Get help right away if:  You have: ? New or increased pain, swelling, or redness in an arm or leg. ? Numbness or tingling in an arm or leg. ? Shortness of breath. ? Chest pain. ? A rapid or irregular heartbeat. ? A severe headache or confusion. ? A cut that will not stop bleeding.  There is blood in your vomit, stool, or urine.  You have a serious fall or accident, or you hit your head.  You feel light-headed or dizzy.  You cough up blood. These symptoms may represent a serious problem that is an emergency. Do not wait to see if the symptoms will go away. Get medical help right away. Call your local emergency services (911 in the [place] Do not drive yourself to the hospital. Summary  Deep vein thrombosis (DVT) is a condition in which a blood clot forms in a deep vein, such as a lower leg, thigh, or arm vein.  Symptoms can include swelling,  warmth, pain, and redness in your leg or arm.  This condition may be treated with a blood thinner (anticoagulant medicine), medicine that is injected to dissolve blood clots,compression stockings, or surgery.  If you are prescribed blood thinners, take them exactly as told. This information is not intended to replace advice given to you by your health care provider. Make sure you discuss any questions you have with your health care provider. Document Revised: 07/13/2017 Document Reviewed: 12/29/2016 Elsevier Patient Education  2020 [employer].

## 2020-02-13 LAB — TSH: TSH: 6.68 u[IU]/mL — ABNORMAL HIGH (ref 0.450–4.500)

## 2020-02-13 LAB — SPECIMEN STATUS REPORT

## 2020-02-19 ENCOUNTER — Other Ambulatory Visit: Payer: Self-pay | Admitting: Nurse Practitioner

## 2020-02-19 MED ORDER — LEVOTHYROXINE SODIUM 100 MCG PO TABS: 100.0000 ug | ORAL_TABLET | Freq: Every day | ORAL | 1 refills | Status: DC

## 2020-02-19 MED FILL — LEVOTHYROXINE SODIUM 100 MC: 100 | 30 days supply | Qty: 30 | Fill #0

## 2020-03-01 MED FILL — LEVOTHYROXINE 88 MCG TABLET: 88 | 30 days supply | Qty: 30 | Fill #2

## 2020-03-01 MED FILL — AMLODIPINE BESYLATE 5 MG TA: 5 | 30 days supply | Qty: 30 | Fill #2

## 2020-03-01 MED FILL — XARELTO 20 MG TABLET: 20 | 30 days supply | Qty: 30 | Fill #2

## 2020-03-04 ENCOUNTER — Telehealth: Payer: Self-pay | Admitting: Hematology and Oncology

## 2020-03-04 NOTE — Telephone Encounter (Signed)
Rescheduled appointment per 7/12 message. Left message on patient's voicemail with updated appointment date and time.

## 2020-03-12 MED FILL — LEVOTHYROXINE SODIUM 100 MC: 100 | 30 days supply | Qty: 30 | Fill #1

## 2020-03-16 ENCOUNTER — Ambulatory Visit: Payer: Self-pay | Attending: Nurse Practitioner | Admitting: Nurse Practitioner

## 2020-03-16 ENCOUNTER — Other Ambulatory Visit: Payer: Self-pay | Admitting: Nurse Practitioner

## 2020-03-16 ENCOUNTER — Encounter: Payer: Self-pay | Admitting: Nurse Practitioner

## 2020-03-16 ENCOUNTER — Other Ambulatory Visit: Payer: Self-pay

## 2020-03-16 DIAGNOSIS — M5416 Radiculopathy, lumbar region: Secondary | ICD-10-CM

## 2020-03-16 DIAGNOSIS — F17200 Nicotine dependence, unspecified, uncomplicated: Secondary | ICD-10-CM

## 2020-03-16 DIAGNOSIS — I10 Essential (primary) hypertension: Secondary | ICD-10-CM

## 2020-03-16 DIAGNOSIS — Z6841 Body Mass Index (BMI) 40.0 and over, adult: Secondary | ICD-10-CM

## 2020-03-16 DIAGNOSIS — E785 Hyperlipidemia, unspecified: Secondary | ICD-10-CM

## 2020-03-16 DIAGNOSIS — E1165 Type 2 diabetes mellitus with hyperglycemia: Secondary | ICD-10-CM

## 2020-03-16 DIAGNOSIS — E039 Hypothyroidism, unspecified: Secondary | ICD-10-CM

## 2020-03-16 MED ORDER — VARENICLINE TARTRATE 1 MG PO TABS: 1.0000 mg | ORAL_TABLET | Freq: Two times a day (BID) | ORAL | 1 refills | Status: AC

## 2020-03-16 MED ORDER — LIDOCAINE 5 % EX PTCH: 1.0000 | MEDICATED_PATCH | CUTANEOUS | 1 refills | Status: DC

## 2020-03-16 MED ORDER — ATORVASTATIN CALCIUM 20 MG PO TABS: 20.0000 mg | ORAL_TABLET | Freq: Every day | ORAL | 3 refills | Status: DC

## 2020-03-16 MED FILL — ATORVASTATIN CALCIUM 20 MG: 20 | 90 days supply | Qty: 90 | Fill #0

## 2020-03-16 MED FILL — LIDOCAINE PATCH 5%: 5 | 30 days supply | Qty: 30 | Fill #0

## 2020-03-16 NOTE — Progress Notes (Signed)
Virtual Visit via Telephone Note Due to national recommendations of social distancing due to COVID 19, telehealth visit is felt to be most appropriate for this patient at this time.  I discussed the limitations, risks, security and privacy concerns of performing an evaluation and management service by telephone and the availability of in person appointments. I also discussed with the patient that there may be a patient responsible charge related to this service. The patient expressed understanding and agreed to proceed.    I connected with Glenn Conrad on 03/16/20  at   1:30 PM EDT  EDT by telephone and verified that I am speaking with the correct person using two identifiers.   Consent I discussed the limitations, risks, security and privacy concerns of performing an evaluation and management service by telephone and the availability of in person appointments. I also discussed with the patient that there may be a patient responsible charge related to this service. The patient expressed understanding and agreed to proceed.   Location of Patient: Private Residence   Location of Provider: [facility]    Persons participating in Telemedicine visit: Glenn Denver FNP-BC [facility] CMA Glenn Conrad    History of Present Illness: Telemedicine visit for: F/U  DM TYPE 2 Currently taking Jardiance 10 mg daily. LDL not at goal. Will need to add low dose atorvastatin today. Does not have his glucose log available but reports most recent reading was 85. There are no symptoms of hypo or hyperglycemia. He could not tolerate metformin or glipizide due to diarrhea.  Lab Results  Component Value Date   HGBA1C 7.3 (H) 12/15/2019   Lab Results  Component Value Date   LDLCALC 92 02/11/2020   Essential Hypertension Well controlled with amlodipine 5 mg daily. Denies chest pain, shortness of breath, palpitations, lightheadedness, dizziness, headaches or BLE edema.  BP  Readings from Last 3 Encounters:  02/12/20 122/80  02/11/20 123/75  01/05/20 138/88   Hypothyroidism Currently taking levothyroxine daily as prescribed. Repeat TSH pending.  Lab Results  Component Value Date   TSH 6.680 (H) 02/11/2020   Chronic Back Pain (lumbar) Uninsured. Patient has been advised to apply for financial assistance and schedule to see our financial counselor.  Has been instructed several times to apply for financial assistance. Back pain is worse with prolonged standing or sitting. Associated symptoms: shooting pain in right foot. Currently taking robaxin. Will add lidoderm patch.  Past Medical History:  Diagnosis Date  . Diabetes mellitus without complication (HCC)   . Hyperlipidemia   . Hypertension   . PE (pulmonary thromboembolism) (HCC)   . Thyroid disease     Past Surgical History:  Procedure Laterality Date  . WISDOM TOOTH EXTRACTION      Family History  Problem Relation Age of Onset  . Diabetes Mother   . Hypertension Mother   . Hypertension Father   . Diabetes Father     Social History   Socioeconomic History  . Marital status: Single    Spouse name: Not on file  . Number of children: Not on file  . Years of education: Not on file  . Highest education level: Not on file  Occupational History  . Not on file  Tobacco Use  . Smoking status: Current Every Day Smoker    Packs/day: 0.50  . Smokeless tobacco: Never Used  Vaping Use  . Vaping Use: Never used  Substance and Sexual Activity  . Alcohol use: No  . Drug use:  No  . Sexual activity: Yes  Other Topics Concern  . Not on file  Social History Narrative  . Not on file   Social Determinants of Health   Financial Resource Strain:   . Difficulty of Paying Living Expenses:   Food Insecurity:   . Worried About Programme researcher, broadcasting/film/video in the Last Year:   . Barista in the Last Year:   Transportation Needs:   . Freight forwarder (Medical):   Marland Kitchen Lack of Transportation  (Non-Medical):   Physical Activity:   . Days of Exercise per Week:   . Minutes of Exercise per Session:   Stress:   . Feeling of Stress :   Social Connections:   . Frequency of Communication with Friends and Family:   . Frequency of Social Gatherings with Friends and Family:   . Attends Religious Services:   . Active Member of Clubs or Organizations:   . Attends Banker Meetings:   Marland Kitchen Marital Status:      Observations/Objective: Awake, alert and oriented x 3   Review of Systems  Constitutional: Negative for fever, malaise/fatigue and weight loss.  HENT: Negative.  Negative for nosebleeds.   Eyes: Negative.  Negative for blurred vision, double vision and photophobia.  Respiratory: Negative.  Negative for cough and shortness of breath.   Cardiovascular: Negative.  Negative for chest pain, palpitations and leg swelling.  Gastrointestinal: Negative.  Negative for heartburn, nausea and vomiting.  Musculoskeletal: Positive for back pain. Negative for myalgias.  Neurological: Positive for sensory change. Negative for dizziness, focal weakness, seizures and headaches.  Psychiatric/Behavioral: Negative.  Negative for suicidal ideas.    Assessment and Plan: Lashan was seen today for follow-up.  Diagnoses and all orders for this visit:  Type 2 diabetes mellitus with hyperglycemia, without long-term current use of insulin (HCC) -     Basic metabolic panel; Future -     Hemoglobin A1c; Future Continue blood sugar control as discussed in office today, low carbohydrate diet, and regular physical exercise as tolerated, 150 minutes per week (30 min each day, 5 days per week, or 50 min 3 days per week). Keep blood sugar logs with fasting goal of 90-130 mg/dl, post prandial (after you eat) less than 180.  For Hypoglycemia: BS <60 and Hyperglycemia BS >400; contact the clinic ASAP. Annual eye exams and foot exams are recommended.   Essential hypertension -     Basic metabolic  panel; Future Continue all antihypertensives as prescribed.  Remember to bring in your blood pressure log with you for your follow up appointment.  DASH/Mediterranean Diets are healthier choices for HTN.    Acquired hypothyroidism -     TSH; Future  Morbid obesity with BMI of 40.0-44.9, adult (HCC) -     atorvastatin (LIPITOR) 20 MG tablet; Take 1 tablet (20 mg total) by mouth daily. Discussed diet and exercise for person with BMI >40. Instructed: You must burn more calories than you eat. Losing 5 percent of your body weight should be considered a success. In the longer term, losing more than 15 percent of your body weight and staying at this weight is an extremely good result. However, keep in mind that even losing 5 percent of your body weight leads to important health benefits, so try not to get discouraged if you're not able to lose more than this. Will recheck weight in 3-6 months.  Lumbar radiculopathy -     lidocaine (LIDODERM) 5 %; Place 1  patch onto the skin daily. Remove & Discard patch every 24 hrs Work on losing weight to help reduce back pain. May alternate with heat and ice application for pain relief. May also alternate with acetaminophen  as prescribed for back pain. Other alternatives include massage, acupuncture and water aerobics.  You must stay active and avoid a sedentary lifestyle.  Tobacco dependence -     varenicline (CHANTIX CONTINUING MONTH PAK) 1 MG tablet; Take 1 tablet (1 mg total) by mouth 2 (two) times daily.  Dyslipidemia, goal LDL below 70 -     atorvastatin (LIPITOR) 20 MG tablet; Take 1 tablet (20 mg total) by mouth daily. INSTRUCTIONS: Work on a low fat, heart healthy diet and participate in regular aerobic exercise program by working out at least 150 minutes per week; 5 days a week-30 minutes per day. Avoid red meat/beef/steak,  fried foods. junk foods, sodas, sugary drinks, unhealthy snacking, alcohol and smoking.  Drink at least 80 oz of water per day and  monitor your carbohydrate intake daily.     Follow Up Instructions Return in about 3 months (around 06/16/2020).     I discussed the assessment and treatment plan with the patient. The patient was provided an opportunity to ask questions and all were answered. The patient agreed with the plan and demonstrated an understanding of the instructions.   The patient was advised to call back or seek an in-person evaluation if the symptoms worsen or if the condition fails to improve as anticipated.  I provided 20 minutes of non-face-to-face time during this encounter including median intraservice time, reviewing previous notes, labs, imaging, medications and explaining diagnosis and management.  Claiborne Rigg, FNP-BC

## 2020-03-24 ENCOUNTER — Ambulatory Visit: Payer: Self-pay | Attending: Nurse Practitioner

## 2020-03-24 ENCOUNTER — Other Ambulatory Visit: Payer: Self-pay

## 2020-03-24 DIAGNOSIS — I10 Essential (primary) hypertension: Secondary | ICD-10-CM

## 2020-03-24 DIAGNOSIS — E1165 Type 2 diabetes mellitus with hyperglycemia: Secondary | ICD-10-CM

## 2020-03-24 DIAGNOSIS — Z6841 Body Mass Index (BMI) 40.0 and over, adult: Secondary | ICD-10-CM

## 2020-03-24 DIAGNOSIS — E039 Hypothyroidism, unspecified: Secondary | ICD-10-CM

## 2020-03-24 MED FILL — ATORVASTATIN CALCIUM 20 MG: 20 | 30 days supply | Qty: 30 | Fill #0

## 2020-03-25 ENCOUNTER — Other Ambulatory Visit: Payer: Self-pay | Admitting: Nurse Practitioner

## 2020-03-25 LAB — BASIC METABOLIC PANEL
BUN/Creatinine Ratio: 11 (ref 9–20)
BUN: 11 mg/dL (ref 6–24)
CO2: 26 mmol/L (ref 20–29)
Calcium: 9.8 mg/dL (ref 8.7–10.2)
Chloride: 98 mmol/L (ref 96–106)
Creatinine, Ser: 1.02 mg/dL (ref 0.76–1.27)
GFR calc Af Amer: 105 mL/min/{1.73_m2} (ref >59)
GFR calc non Af Amer: 91 mL/min/{1.73_m2} (ref >59)
Glucose: 110 mg/dL — ABNORMAL HIGH (ref 65–99)
Potassium: 4.6 mmol/L (ref 3.5–5.2)
Sodium: 138 mmol/L (ref 134–144)

## 2020-03-25 LAB — LIPID PANEL
Chol/HDL Ratio: 4.2 ratio (ref 0.0–5.0)
Cholesterol, Total: 181 mg/dL (ref 100–199)
HDL: 43 mg/dL (ref >39)
LDL Chol Calc (NIH): 123 mg/dL — ABNORMAL HIGH (ref 0–99)
Triglycerides: 80 mg/dL (ref 0–149)
VLDL Cholesterol Cal: 15 mg/dL (ref 5–40)

## 2020-03-25 LAB — TSH: TSH: 4.19 u[IU]/mL (ref 0.450–4.500)

## 2020-03-25 LAB — HEMOGLOBIN A1C
Est. average glucose Bld gHb Est-mCnc: 120 mg/dL
Hgb A1c MFr Bld: 5.8 % — ABNORMAL HIGH (ref 4.8–5.6)

## 2020-03-25 MED ORDER — LEVOTHYROXINE SODIUM 100 MCG PO TABS: 100.0000 ug | ORAL_TABLET | Freq: Every day | ORAL | 0 refills | Status: DC

## 2020-04-05 MED FILL — XARELTO 20 MG TABLET: 20 | 30 days supply | Qty: 30 | Fill #3

## 2020-04-05 MED FILL — AMLODIPINE BESYLATE 5 MG TA: 5 | 30 days supply | Qty: 30 | Fill #3

## 2020-04-09 ENCOUNTER — Encounter: Payer: Self-pay | Admitting: Nurse Practitioner

## 2020-04-09 ENCOUNTER — Other Ambulatory Visit: Payer: Self-pay | Admitting: Nurse Practitioner

## 2020-04-12 ENCOUNTER — Other Ambulatory Visit: Payer: Self-pay | Admitting: Nurse Practitioner

## 2020-04-12 DIAGNOSIS — I2699 Other pulmonary embolism without acute cor pulmonale: Secondary | ICD-10-CM

## 2020-04-12 DIAGNOSIS — M1A9XX0 Chronic gout, unspecified, without tophus (tophi): Secondary | ICD-10-CM

## 2020-04-12 DIAGNOSIS — I824Z1 Acute embolism and thrombosis of unspecified deep veins of right distal lower extremity: Secondary | ICD-10-CM

## 2020-04-12 MED ORDER — GABAPENTIN 300 MG PO CAPS: 300.0000 mg | ORAL_CAPSULE | Freq: Three times a day (TID) | ORAL | 3 refills | Status: DC

## 2020-04-12 MED ORDER — PREDNISONE 20 MG PO TABS: 20.0000 mg | ORAL_TABLET | Freq: Every day | ORAL | 0 refills | Status: AC

## 2020-04-12 MED ORDER — ALLOPURINOL 100 MG PO TABS: 100.0000 mg | ORAL_TABLET | Freq: Every day | ORAL | 6 refills | Status: DC

## 2020-04-12 MED ORDER — RIVAROXABAN 20 MG PO TABS: 20.0000 mg | ORAL_TABLET | Freq: Every day | ORAL | 3 refills | Status: DC

## 2020-04-12 MED FILL — predniSONE 20 MG TABS: 20 | 5 days supply | Qty: 5 | Fill #0

## 2020-04-12 MED FILL — GABAPENTIN 300 MG CAPSULE: 300 | 30 days supply | Qty: 90 | Fill #0

## 2020-04-12 MED FILL — ALLOPURINOL 100 MG TABLET: 100 | 30 days supply | Qty: 30 | Fill #0

## 2020-04-14 MED FILL — LEVOTHYROXINE SODIUM 100 MC: 100 | 30 days supply | Qty: 30 | Fill #0

## 2020-04-15 ENCOUNTER — Ambulatory Visit: Payer: Self-pay | Attending: Nurse Practitioner

## 2020-04-15 ENCOUNTER — Other Ambulatory Visit: Payer: Self-pay

## 2020-04-15 DIAGNOSIS — M1A9XX0 Chronic gout, unspecified, without tophus (tophi): Secondary | ICD-10-CM

## 2020-04-16 LAB — URIC ACID: Uric Acid: 5.5 mg/dL (ref 3.8–8.4)

## 2020-04-27 ENCOUNTER — Other Ambulatory Visit: Payer: Self-pay

## 2020-04-27 DIAGNOSIS — Z79899 Other long term (current) drug therapy: Secondary | ICD-10-CM | POA: Insufficient documentation

## 2020-04-27 DIAGNOSIS — I10 Essential (primary) hypertension: Secondary | ICD-10-CM | POA: Insufficient documentation

## 2020-04-27 DIAGNOSIS — Z7901 Long term (current) use of anticoagulants: Secondary | ICD-10-CM | POA: Insufficient documentation

## 2020-04-27 DIAGNOSIS — F17200 Nicotine dependence, unspecified, uncomplicated: Secondary | ICD-10-CM | POA: Insufficient documentation

## 2020-04-27 DIAGNOSIS — E119 Type 2 diabetes mellitus without complications: Secondary | ICD-10-CM | POA: Insufficient documentation

## 2020-04-27 DIAGNOSIS — Z79890 Hormone replacement therapy (postmenopausal): Secondary | ICD-10-CM | POA: Insufficient documentation

## 2020-04-27 DIAGNOSIS — E039 Hypothyroidism, unspecified: Secondary | ICD-10-CM | POA: Insufficient documentation

## 2020-04-27 DIAGNOSIS — M109 Gout, unspecified: Secondary | ICD-10-CM | POA: Insufficient documentation

## 2020-04-27 LAB — COMPREHENSIVE METABOLIC PANEL
ALT: 34 U/L (ref 0–44)
AST: 15 U/L (ref 15–41)
Albumin: 4 g/dL (ref 3.5–5.0)
Alkaline Phosphatase: 55 U/L (ref 38–126)
Anion gap: 8 (ref 5–15)
BUN: 14 mg/dL (ref 6–20)
CO2: 27 mmol/L (ref 22–32)
Calcium: 8.8 mg/dL — ABNORMAL LOW (ref 8.9–10.3)
Chloride: 104 mmol/L (ref 98–111)
Creatinine, Ser: 0.95 mg/dL (ref 0.61–1.24)
GFR calc Af Amer: >60 mL/min (ref >60)
GFR calc non Af Amer: >60 mL/min (ref >60)
Glucose, Bld: 103 mg/dL — ABNORMAL HIGH (ref 70–99)
Potassium: 3.8 mmol/L (ref 3.5–5.1)
Sodium: 139 mmol/L (ref 135–145)
Total Bilirubin: 0.5 mg/dL (ref 0.3–1.2)
Total Protein: 7.5 g/dL (ref 6.5–8.1)

## 2020-04-27 LAB — CBC WITH DIFFERENTIAL/PLATELET
Abs Immature Granulocytes: 0.01 10*3/uL (ref 0.00–0.07)
Basophils Absolute: 0 10*3/uL (ref 0.0–0.1)
Basophils Relative: 0 %
Eosinophils Absolute: 0.1 10*3/uL (ref 0.0–0.5)
Eosinophils Relative: 1 %
HCT: 43.9 % (ref 39.0–52.0)
Hemoglobin: 14 g/dL (ref 13.0–17.0)
Immature Granulocytes: 0 %
Lymphocytes Relative: 49 %
Lymphs Abs: 3.6 10*3/uL (ref 0.7–4.0)
MCH: 29 pg (ref 26.0–34.0)
MCHC: 31.9 g/dL (ref 30.0–36.0)
MCV: 90.9 fL (ref 80.0–100.0)
Monocytes Absolute: 0.5 10*3/uL (ref 0.1–1.0)
Monocytes Relative: 6 %
Neutro Abs: 3.3 10*3/uL (ref 1.7–7.7)
Neutrophils Relative %: 44 %
Platelets: 212 10*3/uL (ref 150–400)
RBC: 4.83 MIL/uL (ref 4.22–5.81)
RDW: 13.2 % (ref 11.5–15.5)
WBC: 7.4 10*3/uL (ref 4.0–10.5)
nRBC: 0 % (ref 0.0–0.2)

## 2020-04-27 LAB — URINALYSIS, ROUTINE W REFLEX MICROSCOPIC
Bacteria, UA: NONE SEEN
Bilirubin Urine: NEGATIVE
Glucose, UA: >=500 mg/dL — AB
Hgb urine dipstick: NEGATIVE
Ketones, ur: NEGATIVE mg/dL
Leukocytes,Ua: NEGATIVE
Nitrite: NEGATIVE
Protein, ur: NEGATIVE mg/dL
Specific Gravity, Urine: 1.028 (ref 1.005–1.030)
pH: 5 (ref 5.0–8.0)

## 2020-04-28 ENCOUNTER — Encounter: Payer: Self-pay | Admitting: Nurse Practitioner

## 2020-04-28 ENCOUNTER — Emergency Department (HOSPITAL_COMMUNITY)
Admission: EM | Admit: 2020-04-28 | Discharge: 2020-04-28 | Disposition: A | Discharge status: Home / Self Care | Payer: Self-pay | Attending: Emergency Medicine | Admitting: Emergency Medicine

## 2020-04-28 DIAGNOSIS — M109 Gout, unspecified: Secondary | ICD-10-CM

## 2020-04-28 MED ORDER — HYDROCODONE-ACETAMINOPHEN 5-325 MG PO TABS
1.0000 | ORAL_TABLET | Freq: Four times a day (QID) | ORAL | 0 refills | Status: DC | PRN
Start: 2020-04-28 — End: 2020-06-25

## 2020-04-28 MED ORDER — PREDNISONE 20 MG PO TABS
40.0000 mg | ORAL_TABLET | Freq: Once | ORAL | Status: AC
  Administered 2020-04-28: 40 mg via ORAL
  Filled 2020-04-28: qty 2

## 2020-04-28 MED ORDER — PREDNISONE 10 MG PO TABS: ORAL_TABLET | ORAL | 0 refills | Status: DC

## 2020-04-28 MED ORDER — HYDROCODONE-ACETAMINOPHEN 5-325 MG PO TABS
1.0000 | ORAL_TABLET | Freq: Once | ORAL | Status: AC
  Administered 2020-04-28: 1 via ORAL
  Filled 2020-04-28: qty 1

## 2020-04-28 NOTE — ED Provider Notes (Signed)
[facility] DEPT Provider Note   CSN: [id number] Arrival date & time: 04/27/20  2006     History Chief Complaint  Patient presents with  . Knee Pain    Glenn Conrad is a 39 y.o. male.  Glenn Conrad is 39 year old male with PMH significant for HTN, T2DM, HLD, hypothyroidism, prior DVT, and gout who presents today with a chief complaint of right knee pain. He reports that the his right knee has been swollen and inflamed since Sunday and that it feels like a gout flare. He reports the pain as 10/10 and describes it as sharp and like a knife jabbing in his upper knee joint. Patient is unable to ambulate quickly due to the pain and has been unable to drive. The patient has tried ibuprofen, Tylenol, icyhot, and bengay for the pain without relief. He is taking his daily allopurinol as prescribed. He reports that the joint in his right middle finger started to flare a few weeks ago but is less painful. Patient states that the pain in the right middle finger joint is dull in comparison to the knee but he has some difficulty with everyday tasks because he is right hand dominant. His PCP sent in 5 days of 35m prednisone for the gout flare in his right middle finger joint on August 31 and he states he got some initial relief but as soon as he stopped medication it started to return. Pt recently started on anticoagulation for PE. Patient denies fatigue, fever, chills, nausea, vomiting, or bowel changes.        Past Medical History:  Diagnosis Date  . Diabetes mellitus without complication ([facility]   . Hyperlipidemia   . Hypertension   . PE (pulmonary thromboembolism) ([facility]   . Thyroid disease     Patient Active Problem List   Diagnosis Date Noted  . Acute pulmonary embolus ([facility] 12/07/2019  . Hypertension   . Current smoker     Past Surgical History:  Procedure Laterality Date  . WISDOM TOOTH EXTRACTION         Family History  Problem Relation Age of  Onset  . Diabetes Mother   . Hypertension Mother   . Hypertension Father   . Diabetes Father     Social History   Tobacco Use  . Smoking status: Current Every Day Smoker    Packs/day: 0.50  . Smokeless tobacco: Never Used  Vaping Use  . Vaping Use: Never used  Substance Use Topics  . Alcohol use: No  . Drug use: No    Home Medications Prior to Admission medications   Medication Sig Start Date End Date Taking? Authorizing Provider  allopurinol (ZYLOPRIM) 100 MG tablet Take 1 tablet (100 mg total) by mouth daily. 04/12/20   FGildardo Pounds NP  amLODipine (NORVASC) 5 MG tablet Take 1 tablet (5 mg total) by mouth daily. To lower blood pressure 12/15/19 04/13/20  Fulp, Cammie, MD  atorvastatin (LIPITOR) 20 MG tablet Take 1 tablet (20 mg total) by mouth daily. 03/16/20   FGildardo Pounds NP  Blood Glucose Monitoring Suppl (TRUE METRIX METER) w/Device KIT Use as instructed. Check blood glucose level by fingerstick twice per day.  E11.65 01/14/20   FGildardo Pounds NP  gabapentin (NEURONTIN) 300 MG capsule Take 1 capsule (300 mg total) by mouth 3 (three) times daily. 04/12/20   FGildardo Pounds NP  glucose blood (TRUE METRIX BLOOD GLUCOSE TEST) test strip Use as instructed. Check blood glucose level  by fingerstick twice per day. 01/14/20   [name], NP  levothyroxine (SYNTHROID) 100 MCG tablet Take 1 tablet (100 mcg total) by mouth daily. 03/25/20 06/23/20  [name], NP  methocarbamol (ROBAXIN) 500 MG tablet Take 1 tablet (500 mg total) by mouth every 8 (eight) hours as needed for muscle spasms. 12/15/19   Fulp, Cammie, MD  nicotine polacrilex (NICORETTE) 4 MG gum Take 1 each (4 mg total) by mouth as needed for smoking cessation. 01/14/20   [name], NP  ondansetron (ZOFRAN) 4 MG tablet Take 1 tablet (4 mg total) by mouth every 8 (eight) hours as needed for nausea or vomiting. 01/05/20   Fulp, Cammie, MD  pantoprazole (PROTONIX) 40 MG tablet Take 1 tablet (40 mg total) by mouth  daily. To reduce stomach acid 01/14/20   [name], NP  rivaroxaban (XARELTO) 20 MG TABS tablet Take 1 tablet (20 mg total) by mouth daily with breakfast. Please start after you have completed the starter pack. 04/12/20   [name], NP  TRUEplus Lancets 28G MISC Use as instructed. Check blood glucose level by fingerstick twice per day. 01/14/20   [name], NP    Allergies    Glipizide, Lisinopril, and Metformin and related  Review of Systems   Review of Systems  Constitutional: Negative for chills and fever.  Gastrointestinal: Negative for nausea and vomiting.  Musculoskeletal: Positive for arthralgias and joint swelling.  Skin: Negative for color change, rash and wound.  Neurological: Negative for weakness and numbness.    Physical Exam Updated Vital Signs BP 139/90 (BP Location: Right Arm)   Pulse (!) 55   Temp 98.2 F (36.8 C) (Oral)   Resp 20   Ht 6' 3"  (1.905 m)   Wt 134.7 kg   SpO2 95%   BMI 37.12 kg/m   Physical Exam Vitals and nursing note reviewed.  Constitutional:      General: He is not in acute distress.    Appearance: Normal appearance. He is well-developed. He is not ill-appearing or diaphoretic.     Comments: Well-appearing and in no distress  HENT:     Head: Normocephalic and atraumatic.  Eyes:     General:        Right eye: No discharge.        Left eye: No discharge.  Pulmonary:     Effort: Pulmonary effort is normal. No respiratory distress.  Musculoskeletal:        General: Swelling present.     Comments: Localized swelling over the right knee with mild erythema, no effusion noted, patient is able to range the knee but it is very painful, pain does not radiate into the upper or lower leg, no pain at the ankle or hip.  Distal pulses intact, normal strength and sensation Patient also has some localized swelling over the PIP joint of the right middle finger with range of motion limited due to pain, no overlying erythema, good cap refill  sensation  Skin:    General: Skin is warm and dry.  Neurological:     Mental Status: He is alert and oriented to person, place, and time.     Coordination: Coordination normal.  Psychiatric:        Mood and Affect: Mood normal.        Behavior: Behavior normal.     ED Results / Procedures / Treatments   Labs (all labs ordered are listed, but only abnormal results are displayed) Labs  Reviewed  COMPREHENSIVE METABOLIC PANEL - Abnormal; Notable for the following components:      Result Value   Glucose, Bld 103 (*)    Calcium 8.8 (*)    All other components within normal limits  URINALYSIS, ROUTINE W REFLEX MICROSCOPIC - Abnormal; Notable for the following components:   Glucose, UA >=500 (*)    All other components within normal limits  CBC WITH DIFFERENTIAL/PLATELET    EKG None  Radiology No results found.  Procedures Procedures (including critical care time)  Medications Ordered in ED Medications - No data to display  ED Course  I have reviewed the triage vital signs and the nursing notes.  Pertinent labs & imaging results that were available during my care of the patient were reviewed by me and considered in my medical decision making (see chart for details).    MDM Rules/Calculators/A&P                          39 year old male presents with pain and swelling in his right knee that started 4 days ago, worsening despite using over-the-counter therapies.  History of gout and was recently treated for a gout flare in his right middle finger that improved with steroids but never completely resolved.  He takes allopurinol for maintenance therapy and has been compliant with this.  Knee pain feels similar to previous gout episodes he has no fevers or symptoms of systemic illness.  Screening labs were obtained from triage which show no evidence of leukocytosis, no electrolyte derangements.  Patient is diabetic but is very well controlled with medications.  Suspect knee pain is  due to gout flare, I have low suspicion for septic arthritis.  No associated trauma or injury so do not think imaging is indicated.  Will treat with 40 mg of prednisone daily until symptoms begin to improve and then have patient taper steroids down.  Given that he is on anticoagulation would like to have him avoid regular use of NSAIDs so will prescribe short course of pain medication as well.  Stressed the importance of PCP follow-up and return precautions discussed.  Patient expresses understanding and agreement.  Discharged home in good condition.  Final Clinical Impression(s) / ED Diagnoses Final diagnoses:  Acute gout of right knee, unspecified cause    Rx / DC Orders ED Discharge Orders         Ordered    predniSONE (DELTASONE) 10 MG tablet        04/28/20 1257    HYDROcodone-acetaminophen (NORCO) 5-325 MG tablet  Every 6 hours PRN        04/28/20 1257           Jacqlyn Larsen, [place] 04/28/20 1312    Quintella Reichert, MD 04/28/20 1429

## 2020-04-28 NOTE — Discharge Instructions (Signed)
To treat your gout flare we will use a steroid taper.  Take 40 mg of prednisone once daily until you start to note some improvement in your symptoms and then follow instructions to taper prednisone slowly reducing the amount you are taking every 3 days.  I have prescribed a short course of hydrocodone with Tylenol for severe pain, you can take this as needed every 6 hours, it can cause drowsiness and you should not drive a car or operate machinery while taking this medication.  In addition to this you can you take an additional 650 mg of Tylenol every 6 hours.  You can also use over-the-counter salon pas Lidoderm patches.  If you are not seeing improvement in your symptoms over the next few days you should call your primary care doctor.  If you develop fever, worsening redness, swelling or pain in your knee you should return to the ED for reevaluation.

## 2020-04-29 ENCOUNTER — Other Ambulatory Visit: Payer: Self-pay | Admitting: Nurse Practitioner

## 2020-04-29 DIAGNOSIS — I10 Essential (primary) hypertension: Secondary | ICD-10-CM

## 2020-04-29 MED ORDER — AMLODIPINE BESYLATE 5 MG PO TABS: 5.0000 mg | ORAL_TABLET | Freq: Every day | ORAL | 1 refills | Status: DC

## 2020-04-29 MED ORDER — COLCHICINE 0.6 MG PO TABS: ORAL_TABLET | ORAL | 0 refills | Status: DC

## 2020-04-29 MED FILL — AMLODIPINE BESYLATE 5 MG TA: 5 | 30 days supply | Qty: 30 | Fill #0

## 2020-04-29 MED FILL — ATORVASTATIN CALCIUM 20 MG: 20 | 30 days supply | Qty: 30 | Fill #1

## 2020-04-29 MED FILL — COLCHICINE 0.6 MG TABS: 0.6 | 15 days supply | Qty: 30 | Fill #0

## 2020-05-04 MED FILL — XARELTO 20 MG TABLET: 20 | 30 days supply | Qty: 30 | Fill #0

## 2020-05-17 MED FILL — ALLOPURINOL 100 MG TABLET: 100 | 30 days supply | Qty: 30 | Fill #1

## 2020-05-17 MED FILL — LEVOTHYROXINE SODIUM 100 MC: 100 | 30 days supply | Qty: 30 | Fill #1

## 2020-06-02 MED FILL — XARELTO 20 MG TABLET: 20 | 30 days supply | Qty: 30 | Fill #1

## 2020-06-02 MED FILL — AMLODIPINE BESYLATE 5 MG TA: 5 | 30 days supply | Qty: 30 | Fill #1

## 2020-06-04 ENCOUNTER — Telehealth: Payer: Self-pay | Admitting: Hematology and Oncology

## 2020-06-04 ENCOUNTER — Ambulatory Visit: Payer: Self-pay | Admitting: Hematology and Oncology

## 2020-06-04 ENCOUNTER — Other Ambulatory Visit: Payer: Self-pay

## 2020-06-04 NOTE — Telephone Encounter (Signed)
R/s 10/25 per provider pal. Called and spoke with patient. Confirmed new date and time

## 2020-06-07 ENCOUNTER — Inpatient Hospital Stay: Payer: Self-pay | Admitting: Hematology and Oncology

## 2020-06-07 ENCOUNTER — Inpatient Hospital Stay: Payer: Self-pay

## 2020-06-10 MED FILL — ALLOPURINOL 100 MG TABLET: 100 | 30 days supply | Qty: 30 | Fill #2

## 2020-06-10 MED FILL — LEVOTHYROXINE SODIUM 100 MC: 100 | 30 days supply | Qty: 30 | Fill #2

## 2020-06-10 MED FILL — ATORVASTATIN CALCIUM 20 MG: 20 | 30 days supply | Qty: 30 | Fill #2

## 2020-06-11 MED FILL — LIDOCAINE PATCH 5%: 5 | 30 days supply | Qty: 30 | Fill #1

## 2020-06-16 ENCOUNTER — Other Ambulatory Visit: Payer: Self-pay | Admitting: *Deleted

## 2020-06-16 DIAGNOSIS — I82461 Acute embolism and thrombosis of right calf muscular vein: Secondary | ICD-10-CM

## 2020-06-16 DIAGNOSIS — I2609 Other pulmonary embolism with acute cor pulmonale: Secondary | ICD-10-CM

## 2020-06-17 ENCOUNTER — Inpatient Hospital Stay (HOSPITAL_BASED_OUTPATIENT_CLINIC_OR_DEPARTMENT_OTHER): Payer: Self-pay | Admitting: Hematology and Oncology

## 2020-06-17 ENCOUNTER — Encounter: Payer: Self-pay | Admitting: *Deleted

## 2020-06-17 ENCOUNTER — Other Ambulatory Visit: Payer: Self-pay

## 2020-06-17 ENCOUNTER — Other Ambulatory Visit: Payer: Self-pay | Admitting: Hematology and Oncology

## 2020-06-17 ENCOUNTER — Inpatient Hospital Stay: Payer: Self-pay | Attending: Hematology and Oncology

## 2020-06-17 VITALS — BP 114/70 | HR 58 | Temp 97.1°F | Resp 18 | Ht 75.0 in | Wt 283.9 lb

## 2020-06-17 DIAGNOSIS — I82461 Acute embolism and thrombosis of right calf muscular vein: Secondary | ICD-10-CM

## 2020-06-17 DIAGNOSIS — Z86711 Personal history of pulmonary embolism: Secondary | ICD-10-CM | POA: Insufficient documentation

## 2020-06-17 DIAGNOSIS — Z7901 Long term (current) use of anticoagulants: Secondary | ICD-10-CM | POA: Insufficient documentation

## 2020-06-17 DIAGNOSIS — Z86718 Personal history of other venous thrombosis and embolism: Secondary | ICD-10-CM | POA: Insufficient documentation

## 2020-06-17 DIAGNOSIS — I2609 Other pulmonary embolism with acute cor pulmonale: Secondary | ICD-10-CM

## 2020-06-17 DIAGNOSIS — Z79899 Other long term (current) drug therapy: Secondary | ICD-10-CM | POA: Insufficient documentation

## 2020-06-17 LAB — CMP (CANCER CENTER ONLY)
ALT: 34 U/L (ref 0–44)
AST: 18 U/L (ref 15–41)
Albumin: 3.8 g/dL (ref 3.5–5.0)
Alkaline Phosphatase: 55 U/L (ref 38–126)
Anion gap: 6 (ref 5–15)
BUN: 9 mg/dL (ref 6–20)
CO2: 31 mmol/L (ref 22–32)
Calcium: 9.1 mg/dL (ref 8.9–10.3)
Chloride: 105 mmol/L (ref 98–111)
Creatinine: 0.88 mg/dL (ref 0.61–1.24)
GFR, Estimated: >60 mL/min (ref >60)
Glucose, Bld: 132 mg/dL — ABNORMAL HIGH (ref 70–99)
Potassium: 3.7 mmol/L (ref 3.5–5.1)
Sodium: 142 mmol/L (ref 135–145)
Total Bilirubin: 0.6 mg/dL (ref 0.3–1.2)
Total Protein: 6.8 g/dL (ref 6.5–8.1)

## 2020-06-17 LAB — CBC WITH DIFFERENTIAL (CANCER CENTER ONLY)
Abs Immature Granulocytes: 0.01 10*3/uL (ref 0.00–0.07)
Basophils Absolute: 0 10*3/uL (ref 0.0–0.1)
Basophils Relative: 0 %
Eosinophils Absolute: 0.1 10*3/uL (ref 0.0–0.5)
Eosinophils Relative: 2 %
HCT: 40.9 % (ref 39.0–52.0)
Hemoglobin: 13.4 g/dL (ref 13.0–17.0)
Immature Granulocytes: 0 %
Lymphocytes Relative: 35 %
Lymphs Abs: 1.9 10*3/uL (ref 0.7–4.0)
MCH: 28.9 pg (ref 26.0–34.0)
MCHC: 32.8 g/dL (ref 30.0–36.0)
MCV: 88.1 fL (ref 80.0–100.0)
Monocytes Absolute: 0.4 10*3/uL (ref 0.1–1.0)
Monocytes Relative: 7 %
Neutro Abs: 3 10*3/uL (ref 1.7–7.7)
Neutrophils Relative %: 56 %
Platelet Count: 174 10*3/uL (ref 150–400)
RBC: 4.64 MIL/uL (ref 4.22–5.81)
RDW: 13.4 % (ref 11.5–15.5)
WBC Count: 5.4 10*3/uL (ref 4.0–10.5)
nRBC: 0 % (ref 0.0–0.2)

## 2020-06-17 MED ORDER — RIVAROXABAN 10 MG PO TABS: 10.0000 mg | ORAL_TABLET | Freq: Every day | ORAL | 3 refills | Status: DC

## 2020-06-17 NOTE — Progress Notes (Signed)
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 CT PE study showed acute pulmonary infarction in the right lung, most severe in the right lower lobe where there is an occlusive embolus and probable area of pulmonary hemorrhage in the posterior aspect of the right lower lobe from pulmonary infarction. 1) 12/08/2019:Bilateral LE US showed findings consistent with acute deep vein thrombosis involving the right  gastrocnemius veins. No DVT noted in the left leg. Started on Xarelto therapy.  2) 01/02/2020: establish care with Dr. Lorenso Courier   Interval History:  Glenn Conrad 39 y.o. male with medical history significant for Pulmonary Embolism/ Right Lower Extremity DVT presents for a follow up visit. The patient's last visit was on 01/02/2020. In the interim since the last visit he has had no hospitalizations but did have an ED visit for right knee pain, though to be a gout flare.    On exam today Glenn Conrad notes been well in the interim since her last visit.  He notes he has had no bleeding, bruising, or missed doses of his Eliquis.  He notes he is tolerating the medication quite well.  He denies having any his symptoms of repeat clot such as leg swelling, shortness of breath, or chest pain.  He notes that he did have an ED visit for a gout flare but that the pain is currently under control after being treated with steroids, colchicine, and allopurinol.  He currently denies having any issues with fevers, chills, sweats, nausea, vomiting or diarrhea.  A full 10 point ROS is listed below.  MEDICAL HISTORY:  Past Medical History:  Diagnosis Date  . Diabetes mellitus without complication ([place])   . Hyperlipidemia   .  Hypertension   . PE (pulmonary thromboembolism) ([place])   . Thyroid disease     SURGICAL HISTORY: Past Surgical History:  Procedure Laterality Date  . WISDOM TOOTH EXTRACTION      SOCIAL HISTORY: Social History   Socioeconomic History  . Marital status: Single    Spouse name: Not on file  . Number of children: Not on file  . Years of education: Not on file  . Highest education level: Not on file  Occupational History  . Not on file  Tobacco Use  . Smoking status: Current Every Day Smoker    Packs/day: 0.50  . Smokeless tobacco: Never Used  Vaping Use  . Vaping Use: Never used  Substance and Sexual Activity  . Alcohol use: No  . Drug use: No  . Sexual activity: Yes  Other Topics Concern  . Not on file  Social History Narrative  . Not on file   Social Determinants of Health   Financial Resource Strain:   . Difficulty of Paying Living Expenses: Not on file  Food Insecurity:   . Worried About Charity fundraiser in the Last Year: Not on file  . Ran Out of Food in the Last Year: Not on file  Transportation Needs:   . Lack of Transportation (Medical): Not on file  . Lack of Transportation (Non-Medical): Not on file  Physical Activity:   . Days of Exercise per Week: Not on file  . Minutes of Exercise per Session: Not on file  Stress:   . Feeling of Stress :  Not on file  Social Connections:   . Frequency of Communication with Friends and Family: Not on file  . Frequency of Social Gatherings with Friends and Family: Not on file  . Attends Religious Services: Not on file  . Active Member of Clubs or Organizations: Not on file  . Attends Archivist Meetings: Not on file  . Marital Status: Not on file  Intimate Partner Violence:   . Fear of Current or Ex-Partner: Not on file  . Emotionally Abused: Not on file  . Physically Abused: Not on file  . Sexually Abused: Not on file    FAMILY HISTORY: Family History  Problem Relation Age of Onset  . Diabetes  Mother   . Hypertension Mother   . Hypertension Father   . Diabetes Father     ALLERGIES:  is allergic to glipizide, lisinopril, and metformin and related.  MEDICATIONS:  Current Outpatient Medications  Medication Sig Dispense Refill  . allopurinol (ZYLOPRIM) 100 MG tablet Take 1 tablet (100 mg total) by mouth daily. 30 tablet 6  . amLODipine (NORVASC) 5 MG tablet Take 1 tablet (5 mg total) by mouth daily. To lower blood pressure 90 tablet 1  . atorvastatin (LIPITOR) 20 MG tablet Take 1 tablet (20 mg total) by mouth daily. 90 tablet 3  . Blood Glucose Monitoring Suppl (TRUE METRIX METER) w/Device KIT Use as instructed. Check blood glucose level by fingerstick twice per day.  E11.65 1 kit 0  . colchicine 0.6 MG tablet Day 1: Take 0.6 mg 3 times daily on the first day of flare. Initiate as soon as possible, ideally within 12 to 24 hours of flare onset. Day 2 and thereafter:Take 0.6 mg once or twice daily until flare resolves then continue for 2 to 3 days after flare resolves 30 tablet 0  . fluticasone (FLONASE) 50 MCG/ACT nasal spray Place 1 spray into both nostrils daily. 16 g 6  . gabapentin (NEURONTIN) 300 MG capsule Take 1 capsule (300 mg total) by mouth 3 (three) times daily. 90 capsule 3  . glucose blood (TRUE METRIX BLOOD GLUCOSE TEST) test strip Use as instructed. Check blood glucose level by fingerstick twice per day. 100 each 12  . HYDROcodone-acetaminophen (NORCO) 5-325 MG tablet Take 1 tablet by mouth every 6 (six) hours as needed. 10 tablet 0  . levothyroxine (SYNTHROID) 100 MCG tablet Take 1 tablet (100 mcg total) by mouth daily. 90 tablet 0  . methocarbamol (ROBAXIN) 500 MG tablet Take 1 tablet (500 mg total) by mouth every 8 (eight) hours as needed for muscle spasms. 60 tablet 3  . nicotine polacrilex (NICORETTE) 4 MG gum Take 1 each (4 mg total) by mouth as needed for smoking cessation. 100 tablet 0  . ondansetron (ZOFRAN) 4 MG tablet Take 1 tablet (4 mg total) by mouth every 8  (eight) hours as needed for nausea or vomiting. 20 tablet 0  . pantoprazole (PROTONIX) 40 MG tablet Take 1 tablet (40 mg total) by mouth daily. To reduce stomach acid 90 tablet 3  . predniSONE (DELTASONE) 10 MG tablet 4 tabs po daily until symptoms start to improve, then 3 tabs x 3 days, then 2 tabs x 3 days, then 1 tab x 3 days, then 0.5 tabs x 3 days 42 tablet 0  . rivaroxaban (XARELTO) 10 MG TABS tablet Take 1 tablet (10 mg total) by mouth daily. 90 tablet 3  . TRUEplus Lancets 28G MISC Use as instructed. Check blood glucose level by fingerstick twice  per day. 100 each 3   No current facility-administered medications for this visit.    REVIEW OF SYSTEMS:   Constitutional: ( - ) fevers, ( - )  chills , ( - ) night sweats Eyes: ( - ) blurriness of vision, ( - ) double vision, ( - ) watery eyes Ears, nose, mouth, throat, and face: ( - ) mucositis, ( - ) sore throat Respiratory: ( - ) cough, ( - ) dyspnea, ( - ) wheezes Cardiovascular: ( - ) palpitation, ( - ) chest discomfort, ( - ) lower extremity swelling Gastrointestinal:  ( - ) nausea, ( - ) heartburn, ( - ) change in bowel habits Skin: ( - ) abnormal skin rashes Lymphatics: ( - ) new lymphadenopathy, ( - ) easy bruising Neurological: ( - ) numbness, ( - ) tingling, ( - ) new weaknesses Behavioral/Psych: ( - ) mood change, ( - ) new changes  All other systems were reviewed with the patient and are negative.  PHYSICAL EXAMINATION: ECOG PERFORMANCE STATUS: 0 - Asymptomatic  Vitals:   06/17/20 0840  BP: 114/70  Pulse: (!) 58  Resp: 18  Temp: (!) 97.1 F (36.2 C)  SpO2: 100%   Filed Weights   06/17/20 0840  Weight: 283 lb 14.4 oz (128.8 kg)    GENERAL: well appearing middle aged African-American male in no acute distress. alert, no distress and comfortable SKIN: skin color, texture, turgor are normal, no rashes or significant lesions EYES: conjunctiva are pink and non-injected, sclera clear LUNGS: clear to auscultation and  percussion with normal breathing effort HEART: regular rate & rhythm and no murmurs and no lower extremity edema Musculoskeletal: no cyanosis of digits and no clubbing  PSYCH: alert & oriented x 3, fluent speech NEURO: no focal motor/sensory deficits  LABORATORY DATA:  I have reviewed the data as listed CBC Latest Ref Rng & Units 06/17/2020 04/27/2020 01/02/2020  WBC 4.0 - 10.5 K/uL 5.4 7.4 5.7  Hemoglobin 13.0 - 17.0 g/dL 13.4 14.0 13.8  Hematocrit 39 - 52 % 40.9 43.9 42.9  Platelets 150 - 400 K/uL 174 212 181    CMP Latest Ref Rng & Units 06/17/2020 04/27/2020 03/24/2020  Glucose 70 - 99 mg/dL 132(H) 103(H) 110(H)  BUN 6 - 20 mg/dL _0 Creatinine 0.61 - 1.24 mg/dL 0.88 0.95 1.02  Sodium 135 - 145 mmol/L 142 139 138  Potassium 3.5 - 5.1 mmol/L 3.7 3.8 4.6  Chloride 98 - 111 mmol/L 105 104 98  CO2 22 - 32 mmol/L _1 Calcium 8.9 - 10.3 mg/dL 9.1 8.8(L) 9.8  Total Protein 6.5 - 8.1 g/dL 6.8 7.5 -  Total Bilirubin 0.3 - 1.2 mg/dL 0.6 0.5 -  Alkaline Phos 38 - 126 U/L 55 55 -  AST 15 - 41 U/L 18 15 -  ALT 0 - 44 U/L 34 34 -    RADIOGRAPHIC STUDIES: No results found.  ASSESSMENT & PLAN Nellie Chevalier 39 y.o. male with medical history significant for Pulmonary Embolism/ Right Lower Extremity DVT presents for a follow up visit.   On exam today Mr. Payne notes he has been compliant with his medication has had no bleeding, bruising, or dark stools.  He is also having no clear evidence of recurrent VTE.  Today we discussed decreasing the dose of his Xarelto from 20 mg p.o. daily down to 10 mg p.o. daily as he has completed a full 6 months of therapy.  After discussing the risks and  benefits of this transition the patient was agreeable to transitioning down to 10 mg p.o. daily.  We will plan to see him back in approximately 6 months time to see how he is tolerating the treatment.  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  --decreased Protein S activity in the setting of acute thrombus is of little utility. Will need to recheck today --no clear evidence of a hypercoagulable disorder based on evaluation performed 12/07/2019.  --patient is agreeable to decreasing down to 43m PO daily Xarelto after he completes his current store of 213mPO tabs. --RTC in 6 months time to re-evaluate or sooner if new symptoms arise.   No orders of the defined types were placed in this encounter.   All questions were answered. The patient knows to call the clinic with any problems, questions or concerns.  A total of more than 30 minutes were spent on this encounter and over half of that time was spent on counseling and coordination of care as outlined above.   JoLedell PeoplesMD Department of Hematology/Oncology [facility]: [phone]: [phone]: [email or web address]  06/22/2020 5:29 PM

## 2020-06-19 LAB — PROTEIN S, TOTAL: Protein S Ag, Total: 67 % (ref 60–150)

## 2020-06-21 ENCOUNTER — Encounter: Payer: Self-pay | Admitting: Nurse Practitioner

## 2020-06-21 ENCOUNTER — Other Ambulatory Visit: Payer: Self-pay | Admitting: Nurse Practitioner

## 2020-06-21 MED ORDER — FLUTICASONE PROPIONATE 50 MCG/ACT NA SUSP: 1.0000 | Freq: Every day | NASAL | 6 refills | Status: DC

## 2020-06-22 ENCOUNTER — Encounter: Payer: Self-pay | Admitting: Hematology and Oncology

## 2020-06-23 ENCOUNTER — Telehealth: Payer: Self-pay | Admitting: Hematology and Oncology

## 2020-06-23 NOTE — Telephone Encounter (Signed)
Called pt per 11/9 sch msg - pt aware appts added- reminder letter mailed with appt date and time

## 2020-06-25 ENCOUNTER — Other Ambulatory Visit: Payer: Self-pay

## 2020-06-25 ENCOUNTER — Encounter: Payer: Self-pay | Admitting: Nurse Practitioner

## 2020-06-25 ENCOUNTER — Ambulatory Visit: Payer: Self-pay | Attending: Nurse Practitioner | Admitting: Nurse Practitioner

## 2020-06-25 ENCOUNTER — Other Ambulatory Visit: Payer: Self-pay | Admitting: Nurse Practitioner

## 2020-06-25 VITALS — BP 104/65 | HR 62 | Ht 74.0 in | Wt 280.0 lb

## 2020-06-25 DIAGNOSIS — E1165 Type 2 diabetes mellitus with hyperglycemia: Secondary | ICD-10-CM

## 2020-06-25 DIAGNOSIS — I10 Essential (primary) hypertension: Secondary | ICD-10-CM

## 2020-06-25 DIAGNOSIS — E039 Hypothyroidism, unspecified: Secondary | ICD-10-CM

## 2020-06-25 MED ORDER — LEVOTHYROXINE SODIUM 100 MCG PO TABS: 100.0000 ug | ORAL_TABLET | Freq: Every day | ORAL | 1 refills | Status: DC

## 2020-06-25 NOTE — Progress Notes (Signed)
Assessment & Plan:  Glenn Conrad was seen today for hypertension.  Diagnoses and all orders for this visit:  Essential hypertension  Acquired hypothyroidism -     TSH  Type 2 diabetes mellitus with hyperglycemia, without long-term current use of insulin (HCC) -     Hemoglobin A1c  Other orders -     levothyroxine (SYNTHROID) 100 MCG tablet; Take 1 tablet (100 mcg total) by mouth daily.    Patient has been counseled on age-appropriate routine health concerns for screening and prevention. These are reviewed and up-to-date. Referrals have been placed accordingly. Immunizations are up-to-date or declined.    Subjective:   Chief Complaint  Patient presents with  . Hypertension   HPI Glenn Conrad 39 y.o. male presents to office today for follow up. 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   He is doing well. Has no complaints. Ready to "come off some of these medications". Last A1c 5.3. Will recheck today and if low will stop Jardiance.  He has not been checking his blood sugars at home. LDL not at goal however he does endorse medication adherence taking atorvastatin 20 mg daily. Lab Results  Component Value Date   LDLCALC 123 (H) 03/24/2020   Lab Results  Component Value Date   HGBA1C 5.8 (H) 03/24/2020     Essential Hypertension Well controlled with amlodipine 5 mg daily.  He does not monitor his blood pressure at home. Denies chest pain, shortness of breath, palpitations, lightheadedness, dizziness, headaches or BLE edema. He does continue to smoke. Declines smoking cessation therapy today.  BP Readings from Last 3 Encounters:  06/25/20 104/65  06/17/20 114/70  04/28/20 (!) 143/89   Hypothyroidism Thyroid level is normal. He is taking Levothyroxine 100 mcg daily as prescribed. He denies palpitations, constipation,diarrhea, nervousness,  fatigue.  Lab Results  Component Value Date   TSH 4.190 03/24/2020   Review of Systems  Constitutional: Negative for fever, malaise/fatigue and weight loss.  HENT: Negative.  Negative for nosebleeds.   Eyes: Negative.  Negative for blurred vision, double vision and photophobia.  Respiratory: Negative.  Negative for cough and shortness of breath.   Cardiovascular: Negative.  Negative for chest pain, palpitations and leg swelling.  Gastrointestinal: Negative.  Negative for heartburn, nausea and vomiting.  Musculoskeletal: Negative.  Negative for myalgias.  Neurological: Negative.  Negative for dizziness, focal weakness, seizures and headaches.  Psychiatric/Behavioral: Negative.  Negative for suicidal ideas.    Past Medical History:  Diagnosis Date  . Diabetes mellitus without complication ([place])   . Hyperlipidemia   . Hypertension   . PE (pulmonary thromboembolism) ([place])   . Right leg DVT ([place])   . Thyroid disease     Past Surgical History:  Procedure Laterality Date  . WISDOM TOOTH EXTRACTION      Family History  Problem Relation Age of Onset  . Diabetes Mother   . Hypertension Mother   . Hypertension Father   . Diabetes Father     Social History Reviewed with no changes to be made today.   Outpatient Medications Prior to Visit  Medication Sig Dispense Refill  . allopurinol (ZYLOPRIM) 100 MG tablet Take 1 tablet (100 mg total) by mouth daily. 30 tablet 6  . amLODipine (NORVASC) 5 MG tablet Take 1 tablet (5 mg total) by mouth daily. To lower blood pressure 90 tablet 1  . atorvastatin (LIPITOR) 20 MG tablet Take 1  tablet (20 mg total) by mouth daily. 90 tablet 3  . Blood Glucose Monitoring Suppl (TRUE METRIX METER) w/Device KIT Use as instructed. Check blood glucose level by fingerstick twice per day.  E11.65 1 kit 0  . colchicine 0.6 MG tablet Day 1: Take 0.6 mg 3 times daily on the first day of flare. Initiate as soon as possible, ideally within 12 to 24 hours of flare  onset. Day 2 and thereafter:Take 0.6 mg once or twice daily until flare resolves then continue for 2 to 3 days after flare resolves 30 tablet 0  . fluticasone (FLONASE) 50 MCG/ACT nasal spray Place 1 spray into both nostrils daily. 16 g 6  . gabapentin (NEURONTIN) 300 MG capsule Take 1 capsule (300 mg total) by mouth 3 (three) times daily. 90 capsule 3  . glucose blood (TRUE METRIX BLOOD GLUCOSE TEST) test strip Use as instructed. Check blood glucose level by fingerstick twice per day. 100 each 12  . methocarbamol (ROBAXIN) 500 MG tablet Take 1 tablet (500 mg total) by mouth every 8 (eight) hours as needed for muscle spasms. 60 tablet 3  . ondansetron (ZOFRAN) 4 MG tablet Take 1 tablet (4 mg total) by mouth every 8 (eight) hours as needed for nausea or vomiting. 20 tablet 0  . pantoprazole (PROTONIX) 40 MG tablet Take 1 tablet (40 mg total) by mouth daily. To reduce stomach acid 90 tablet 3  . rivaroxaban (XARELTO) 10 MG TABS tablet Take 1 tablet (10 mg total) by mouth daily. 90 tablet 3  . TRUEplus Lancets 28G MISC Use as instructed. Check blood glucose level by fingerstick twice per day. 100 each 3  . HYDROcodone-acetaminophen (NORCO) 5-325 MG tablet Take 1 tablet by mouth every 6 (six) hours as needed. (Patient not taking: Reported on 06/25/2020) 10 tablet 0  . levothyroxine (SYNTHROID) 100 MCG tablet Take 1 tablet (100 mcg total) by mouth daily. 90 tablet 0  . nicotine polacrilex (NICORETTE) 4 MG gum Take 1 each (4 mg total) by mouth as needed for smoking cessation. (Patient not taking: Reported on 06/25/2020) 100 tablet 0  . predniSONE (DELTASONE) 10 MG tablet 4 tabs po daily until symptoms start to improve, then 3 tabs x 3 days, then 2 tabs x 3 days, then 1 tab x 3 days, then 0.5 tabs x 3 days (Patient not taking: Reported on 06/25/2020) 42 tablet 0   No facility-administered medications prior to visit.    Allergies  Allergen Reactions  . Glipizide Diarrhea  . Lisinopril     Headaches     . Metformin And Related Diarrhea       Objective:    BP 104/65   Pulse 62   Ht _0  (1.88 m)   Wt 280 lb (127 kg)   SpO2 98%   BMI 35.95 kg/m  Wt Readings from Last 3 Encounters:  06/25/20 280 lb (127 kg)  06/17/20 283 lb 14.4 oz (128.8 kg)  04/27/20 297 lb (134.7 kg)    Physical Exam       Patient has been counseled extensively about nutrition and exercise as well as the importance of adherence with medications and regular follow-up. The patient was given clear instructions to go to ER or return to medical center if symptoms don't improve, worsen or new problems develop. The patient verbalized understanding.   Follow-up: Return in about 3 months (around 09/25/2020).   [name], FNP-BC [facility] [place], [place]   06/25/2020,  8:50 AM

## 2020-06-26 LAB — HEMOGLOBIN A1C
Est. average glucose Bld gHb Est-mCnc: 134 mg/dL
Hgb A1c MFr Bld: 6.3 % — ABNORMAL HIGH (ref 4.8–5.6)

## 2020-06-26 LAB — TSH: TSH: 1.64 u[IU]/mL (ref 0.450–4.500)

## 2020-06-30 ENCOUNTER — Other Ambulatory Visit: Payer: Self-pay | Admitting: Nurse Practitioner

## 2020-06-30 MED ORDER — EMPAGLIFLOZIN 10 MG PO TABS: 10.0000 mg | ORAL_TABLET | Freq: Every day | ORAL | 1 refills | Status: AC

## 2020-07-05 MED FILL — XARELTO 20 MG TABLET: 20 | 30 days supply | Qty: 30 | Fill #2

## 2020-07-05 MED FILL — AMLODIPINE BESYLATE 5 MG TA: 5 | 30 days supply | Qty: 30 | Fill #2

## 2020-07-14 MED FILL — LEVOTHYROXINE SODIUM 100 MC: 100 | 30 days supply | Qty: 30 | Fill #0

## 2020-07-14 MED FILL — ALLOPURINOL 100 MG TABLET: 100 | 30 days supply | Qty: 30 | Fill #3

## 2020-07-19 ENCOUNTER — Encounter: Payer: Self-pay | Admitting: Nurse Practitioner

## 2020-07-22 ENCOUNTER — Other Ambulatory Visit: Payer: Self-pay | Admitting: Nurse Practitioner

## 2020-07-22 ENCOUNTER — Encounter: Payer: Self-pay | Admitting: Nurse Practitioner

## 2020-07-22 DIAGNOSIS — M5416 Radiculopathy, lumbar region: Secondary | ICD-10-CM

## 2020-07-22 MED FILL — LIDOCAINE PATCH 5%: 5 | 30 days supply | Qty: 30 | Fill #0

## 2020-07-22 NOTE — Telephone Encounter (Signed)
Pt calling regarding this message. He states that the pharmacy still has not received the prescription for this medication. Please advise.

## 2020-07-22 NOTE — Telephone Encounter (Signed)
Spoke to patient. Pt. Is aware Lidocaine patches are ready and he can pick it up at [facility] pharmacy.

## 2020-08-02 MED FILL — ATORVASTATIN CALCIUM 20 MG: 20 | 30 days supply | Qty: 30 | Fill #3

## 2020-08-02 MED FILL — XARELTO 20 MG TABLET: 20 | 30 days supply | Qty: 30 | Fill #3

## 2020-08-02 MED FILL — AMLODIPINE BESYLATE 5 MG TA: 5 | 30 days supply | Qty: 30 | Fill #3

## 2020-08-12 ENCOUNTER — Other Ambulatory Visit: Payer: Self-pay

## 2020-08-12 ENCOUNTER — Encounter (HOSPITAL_COMMUNITY): Payer: Self-pay | Admitting: Emergency Medicine

## 2020-08-12 ENCOUNTER — Emergency Department (HOSPITAL_COMMUNITY)
Admission: EM | Admit: 2020-08-12 | Discharge: 2020-08-12 | Disposition: A | Discharge status: Home / Self Care | Payer: Self-pay | Attending: Emergency Medicine | Admitting: Emergency Medicine

## 2020-08-12 DIAGNOSIS — R519 Headache, unspecified: Secondary | ICD-10-CM | POA: Insufficient documentation

## 2020-08-12 DIAGNOSIS — Z5321 Procedure and treatment not carried out due to patient leaving prior to being seen by health care provider: Secondary | ICD-10-CM | POA: Insufficient documentation

## 2020-08-12 NOTE — ED Triage Notes (Signed)
Pt states that he was exposed to covid this weekend and now has a headache. Alert and oriented.

## 2020-08-16 MED FILL — LEVOTHYROXINE SODIUM 100 MC: 100 | 30 days supply | Qty: 30 | Fill #1

## 2020-08-16 MED FILL — ALLOPURINOL 100 MG TABLET: 100 | 30 days supply | Qty: 30 | Fill #4

## 2020-08-19 ENCOUNTER — Encounter: Payer: Self-pay | Admitting: Nurse Practitioner

## 2020-08-30 MED FILL — XARELTO 10 MG TABLET: 10 | 30 days supply | Qty: 30 | Fill #0

## 2020-08-30 MED FILL — AMLODIPINE BESYLATE 5 MG TA: 5 | 30 days supply | Qty: 30 | Fill #4

## 2020-09-03 ENCOUNTER — Other Ambulatory Visit: Payer: Self-pay | Admitting: Nurse Practitioner

## 2020-09-03 ENCOUNTER — Encounter: Payer: Self-pay | Admitting: Nurse Practitioner

## 2020-09-03 MED ORDER — LIDOCAINE 5 % EX PTCH: 1.0000 | MEDICATED_PATCH | CUTANEOUS | 0 refills | Status: DC

## 2020-09-03 MED FILL — LIDOCAINE PATCH 5%: 5 | 30 days supply | Qty: 30 | Fill #0

## 2020-09-03 MED FILL — ATORVASTATIN CALCIUM 20 MG: 20 | 30 days supply | Qty: 30 | Fill #4

## 2020-09-15 MED FILL — ALLOPURINOL 100 MG TABLET: 100 | 30 days supply | Qty: 30 | Fill #5

## 2020-09-15 MED FILL — LEVOTHYROXINE SODIUM 100 MC: 100 | 30 days supply | Qty: 30 | Fill #2

## 2020-09-30 MED FILL — AMLODIPINE BESYLATE 5 MG TA: 5 | 30 days supply | Qty: 30 | Fill #5

## 2020-09-30 MED FILL — XARELTO 10 MG TABLET: 10 | 30 days supply | Qty: 30 | Fill #1

## 2020-10-04 MED FILL — ATORVASTATIN CALCIUM 20 MG: 20 | 30 days supply | Qty: 30 | Fill #5

## 2020-10-13 MED FILL — ALLOPURINOL 100 MG TABLET: 100 | 30 days supply | Qty: 30 | Fill #6

## 2020-10-13 MED FILL — LEVOTHYROXINE SODIUM 100 MC: 100 | 30 days supply | Qty: 30 | Fill #3

## 2020-10-20 ENCOUNTER — Ambulatory Visit: Payer: Self-pay | Attending: Nurse Practitioner | Admitting: Nurse Practitioner

## 2020-10-20 ENCOUNTER — Encounter: Payer: Self-pay | Admitting: Nurse Practitioner

## 2020-10-20 ENCOUNTER — Other Ambulatory Visit: Payer: Self-pay | Admitting: Nurse Practitioner

## 2020-10-20 ENCOUNTER — Other Ambulatory Visit: Payer: Self-pay

## 2020-10-20 DIAGNOSIS — Z1159 Encounter for screening for other viral diseases: Secondary | ICD-10-CM

## 2020-10-20 DIAGNOSIS — E785 Hyperlipidemia, unspecified: Secondary | ICD-10-CM

## 2020-10-20 DIAGNOSIS — I10 Essential (primary) hypertension: Secondary | ICD-10-CM

## 2020-10-20 DIAGNOSIS — E1165 Type 2 diabetes mellitus with hyperglycemia: Secondary | ICD-10-CM

## 2020-10-20 DIAGNOSIS — M1A9XX0 Chronic gout, unspecified, without tophus (tophi): Secondary | ICD-10-CM

## 2020-10-20 DIAGNOSIS — E039 Hypothyroidism, unspecified: Secondary | ICD-10-CM

## 2020-10-20 MED ORDER — TRUE METRIX BLOOD GLUCOSE TEST VI STRP: ORAL_STRIP | 12 refills | Status: DC

## 2020-10-20 MED ORDER — ALLOPURINOL 100 MG PO TABS: 100.0000 mg | ORAL_TABLET | Freq: Every day | ORAL | 1 refills | Status: DC

## 2020-10-20 MED ORDER — ATORVASTATIN CALCIUM 20 MG PO TABS: 20.0000 mg | ORAL_TABLET | Freq: Every day | ORAL | 3 refills | Status: DC

## 2020-10-20 MED ORDER — TRUEPLUS LANCETS 28G MISC: 3 refills | Status: DC

## 2020-10-20 MED ORDER — EMPAGLIFLOZIN 10 MG PO TABS: 10.0000 mg | ORAL_TABLET | Freq: Every day | ORAL | 1 refills | Status: DC

## 2020-10-20 MED ORDER — AMLODIPINE BESYLATE 5 MG PO TABS: 5.0000 mg | ORAL_TABLET | Freq: Every day | ORAL | 1 refills | Status: DC

## 2020-10-20 MED ORDER — LEVOTHYROXINE SODIUM 100 MCG PO TABS: 100.0000 ug | ORAL_TABLET | Freq: Every day | ORAL | 1 refills | Status: DC

## 2020-10-20 MED FILL — TRUEplus LANCETS 28G MISC: 50 days supply | Qty: 100 | Fill #0

## 2020-10-20 MED FILL — TRUE METRIX GLUCOSE TEST ST: 50 days supply | Qty: 100 | Fill #0

## 2020-10-20 NOTE — Progress Notes (Signed)
Virtual Visit via Telephone Note Due to national recommendations of social distancing due to [place] 19, telehealth visit is felt to be most appropriate for this patient at this time.  I discussed the limitations, risks, security and privacy concerns of performing an evaluation and management service by telephone and the availability of in person appointments. I also discussed with the patient that there may be a patient responsible charge related to this service. The patient expressed understanding and agreed to proceed.    I connected with Glenn Conrad on 10/20/20  at   2:10 PM EST  EDT by telephone and verified that I am speaking with the correct person using two identifiers.   Consent I discussed the limitations, risks, security and privacy concerns of performing an evaluation and management service by telephone and the availability of in person appointments. I also discussed with the patient that there may be a patient responsible charge related to this service. The patient expressed understanding and agreed to proceed.   Location of Patient: Private Residence   Location of Provider: [place] and [place] participating in Telemedicine visit: Geryl Rankins FNP-BC [place]    History of Present Illness: Telemedicine visit for: Follow up. He has a past medical history of DM2, Hyperlipidemia, Hypertension, PE, Right leg DVT (taking xarelto), and Thyroid disease.  DM 2 He does not monitor his blood glucose levels daily. States last reading was 102. Well controlled with Jardiance 10 mg daily. LDL not at goal with atorvastatin 20 mg daily.  Lab Results  Component Value Date   HGBA1C 6.3 (H) 06/25/2020   Lab Results  Component Value Date   LDLCALC 123 (H) 03/24/2020  The 10-year ASCVD risk score Mikey Bussing DC Jr., et al., 2013) is: 19.6%   Values used to calculate the score:     Age: 40 years     Sex: Male     Is Non-Hispanic African  American: Yes     Diabetic: Yes     Tobacco smoker: Yes     Systolic Blood Pressure: 354 mmHg     Is BP treated: Yes     HDL Cholesterol: 43 mg/dL     Total Cholesterol: 181 mg/dL    Essential Hypertension Blood pressure is not optimized with amlodipine 5 mg daily. Will have him return for office visit/BP Check. Denies chest pain, shortness of breath, palpitations, lightheadedness, dizziness, headaches or BLE edema.  BP Readings from Last 3 Encounters:  08/12/20 138/90  06/25/20 104/65  06/17/20 114/70   Hypothyroidism Well controlled with levothyroxine 100 mg daily. He denies any weight fluctuations, diarrhea, constipation, anxiety, or palpitations.   Past Medical History:  Diagnosis Date  . Diabetes mellitus without complication ([place])   . Hyperlipidemia   . Hypertension   . PE (pulmonary thromboembolism) ([place])   . Right leg DVT ([place])   . Thyroid disease     Past Surgical History:  Procedure Laterality Date  . WISDOM TOOTH EXTRACTION      Family History  Problem Relation Age of Onset  . Diabetes Mother   . Hypertension Mother   . Hypertension Father   . Diabetes Father     Social History   Socioeconomic History  . Marital status: Single    Spouse name: Not on file  . Number of children: Not on file  . Years of education: Not on file  . Highest education level: Not on file  Occupational History  . Not  on file  Tobacco Use  . Smoking status: Current Every Day Smoker    Packs/day: 0.50  . Smokeless tobacco: Never Used  Vaping Use  . Vaping Use: Never used  Substance and Sexual Activity  . Alcohol use: No  . Drug use: No  . Sexual activity: Yes  Other Topics Concern  . Not on file  Social History Narrative  . Not on file   Social Determinants of Health   Financial Resource Strain: Not on file  Food Insecurity: Not on file  Transportation Needs: Not on file  Physical Activity: Not on file  Stress: Not on file  Social Connections: Not on file      Observations/Objective: Awake, alert and oriented x 3   Review of Systems  Constitutional: Negative for fever, malaise/fatigue and weight loss.  HENT: Negative.  Negative for nosebleeds.   Eyes: Negative.  Negative for blurred vision, double vision and photophobia.  Respiratory: Negative.  Negative for cough and shortness of breath.   Cardiovascular: Negative.  Negative for chest pain, palpitations and leg swelling.  Gastrointestinal: Negative.  Negative for heartburn, nausea and vomiting.  Musculoskeletal: Negative.  Negative for myalgias.  Neurological: Negative.  Negative for dizziness, focal weakness, seizures and headaches.  Psychiatric/Behavioral: Negative.  Negative for suicidal ideas.    Assessment and Plan: Glenn Conrad was seen today for diabetes and hypertension.  Diagnoses and all orders for this visit:  Type 2 diabetes mellitus with hyperglycemia, without long-term current use of insulin (HCC) -     glucose blood (TRUE METRIX BLOOD GLUCOSE TEST) test strip; Use as instructed. Check blood glucose level by fingerstick twice per day. -     TRUEplus Lancets 28G MISC; Use as instructed. Check blood glucose level by fingerstick twice per day. -     empagliflozin (JARDIANCE) 10 MG TABS tablet; Take 1 tablet (10 mg total) by mouth daily before breakfast. Continue blood sugar control as discussed in office today, low carbohydrate diet, and regular physical exercise as tolerated, 150 minutes per week (30 min each day, 5 days per week, or 50 min 3 days per week). Keep blood sugar logs with fasting goal of 90-130 mg/dl, post prandial (after you eat) less than 180.  For Hypoglycemia: BS <60 and Hyperglycemia BS >400; contact the clinic ASAP. Annual eye exams and foot exams are recommended.   Essential hypertension -     amLODipine (NORVASC) 5 MG tablet; Take 1 tablet (5 mg total) by mouth daily. To lower blood pressure -     CMP14+EGFR; Future Continue all antihypertensives as  prescribed.  Remember to bring in your blood pressure log with you for your follow up appointment.  DASH/Mediterranean Diets are healthier choices for HTN.   Dyslipidemia, goal LDL below 70 -     atorvastatin (LIPITOR) 20 MG tablet; Take 1 tablet (20 mg total) by mouth daily. -     Lipid panel; Future INSTRUCTIONS: Work on a low fat, heart healthy diet and participate in regular aerobic exercise program by working out at least 150 minutes per week; 5 days a week-30 minutes per day. Avoid red meat/beef/steak,  fried foods. junk foods, sodas, sugary drinks, unhealthy snacking, alcohol and smoking.  Drink at least 80 oz of water per day and monitor your carbohydrate intake daily.   Acquired hypothyroidism -     levothyroxine (SYNTHROID) 100 MCG tablet; Take 1 tablet (100 mcg total) by mouth daily. -     TSH; Future  Need for hepatitis C screening  test -     HCV Ab w Reflex to Quant PCR; Future  Chronic gout without tophus, unspecified cause, unspecified site -     allopurinol (ZYLOPRIM) 100 MG tablet; Take 1 tablet (100 mg total) by mouth daily. Denies any recent gout flar   Follow Up Instructions Return in about 3 months (around 01/20/2021).     I discussed the assessment and treatment plan with the patient. The patient was provided an opportunity to ask questions and all were answered. The patient agreed with the plan and demonstrated an understanding of the instructions.   The patient was advised to call back or seek an in-person evaluation if the symptoms worsen or if the condition fails to improve as anticipated.  I provided 18 minutes of non-face-to-face time during this encounter including median intraservice time, reviewing previous notes, labs, imaging, medications and explaining diagnosis and management.  [name], FNP-BC

## 2020-10-26 ENCOUNTER — Encounter (HOSPITAL_COMMUNITY): Payer: Self-pay

## 2020-10-26 ENCOUNTER — Emergency Department (HOSPITAL_COMMUNITY)
Admission: EM | Admit: 2020-10-26 | Discharge: 2020-10-26 | Disposition: A | Discharge status: Home / Self Care | Payer: Self-pay | Attending: Emergency Medicine | Admitting: Emergency Medicine

## 2020-10-26 ENCOUNTER — Emergency Department (HOSPITAL_COMMUNITY): Payer: Self-pay

## 2020-10-26 ENCOUNTER — Other Ambulatory Visit: Payer: Self-pay

## 2020-10-26 DIAGNOSIS — E119 Type 2 diabetes mellitus without complications: Secondary | ICD-10-CM | POA: Insufficient documentation

## 2020-10-26 DIAGNOSIS — Z7984 Long term (current) use of oral hypoglycemic drugs: Secondary | ICD-10-CM | POA: Insufficient documentation

## 2020-10-26 DIAGNOSIS — F17200 Nicotine dependence, unspecified, uncomplicated: Secondary | ICD-10-CM | POA: Insufficient documentation

## 2020-10-26 DIAGNOSIS — R0789 Other chest pain: Secondary | ICD-10-CM | POA: Insufficient documentation

## 2020-10-26 DIAGNOSIS — R748 Abnormal levels of other serum enzymes: Secondary | ICD-10-CM | POA: Insufficient documentation

## 2020-10-26 DIAGNOSIS — I10 Essential (primary) hypertension: Secondary | ICD-10-CM | POA: Insufficient documentation

## 2020-10-26 DIAGNOSIS — Z7901 Long term (current) use of anticoagulants: Secondary | ICD-10-CM | POA: Insufficient documentation

## 2020-10-26 DIAGNOSIS — Z79899 Other long term (current) drug therapy: Secondary | ICD-10-CM | POA: Insufficient documentation

## 2020-10-26 LAB — BASIC METABOLIC PANEL
Anion gap: 7 (ref 5–15)
BUN: 14 mg/dL (ref 6–20)
CO2: 31 mmol/L (ref 22–32)
Calcium: 9.5 mg/dL (ref 8.9–10.3)
Chloride: 104 mmol/L (ref 98–111)
Creatinine, Ser: 1.12 mg/dL (ref 0.61–1.24)
GFR, Estimated: >60 mL/min (ref >60)
Glucose, Bld: 93 mg/dL (ref 70–99)
Potassium: 4 mmol/L (ref 3.5–5.1)
Sodium: 142 mmol/L (ref 135–145)

## 2020-10-26 LAB — HEPATIC FUNCTION PANEL
ALT: 46 U/L — ABNORMAL HIGH (ref 0–44)
AST: 23 U/L (ref 15–41)
Albumin: 4 g/dL (ref 3.5–5.0)
Alkaline Phosphatase: 49 U/L (ref 38–126)
Bilirubin, Direct: 0.1 mg/dL (ref 0.0–0.2)
Indirect Bilirubin: 0.5 mg/dL (ref 0.3–0.9)
Total Bilirubin: 0.6 mg/dL (ref 0.3–1.2)
Total Protein: 7.3 g/dL (ref 6.5–8.1)

## 2020-10-26 LAB — CBC
HCT: 43.5 % (ref 39.0–52.0)
Hemoglobin: 14 g/dL (ref 13.0–17.0)
MCH: 30.1 pg (ref 26.0–34.0)
MCHC: 32.2 g/dL (ref 30.0–36.0)
MCV: 93.5 fL (ref 80.0–100.0)
Platelets: 193 10*3/uL (ref 150–400)
RBC: 4.65 MIL/uL (ref 4.22–5.81)
RDW: 12.8 % (ref 11.5–15.5)
WBC: 5.5 10*3/uL (ref 4.0–10.5)
nRBC: 0 % (ref 0.0–0.2)

## 2020-10-26 LAB — LIPASE, BLOOD: Lipase: 71 U/L — ABNORMAL HIGH (ref 11–51)

## 2020-10-26 LAB — TROPONIN I (HIGH SENSITIVITY)
Troponin I (High Sensitivity): 3 ng/L (ref <18)
Troponin I (High Sensitivity): 3 ng/L (ref <18)

## 2020-10-26 MED ORDER — IOHEXOL 350 MG/ML SOLN
75.0000 mL | Freq: Once | INTRAVENOUS | Status: AC | PRN
  Administered 2020-10-26: 75 mL via INTRAVENOUS

## 2020-10-26 NOTE — Discharge Instructions (Addendum)
Chest pain  Your work-up today was reassuring.    Follow-up: For patients who experience episodes of chest pain, we recommend follow-up for evaluation by cardiology.  Call to make an appointment.  Return: Return to the emergency department for persistent or worsening chest pain, shortness of breath, dizziness, passing out, chest pain along with back or abdominal pain, weakness in the arms or legs, or any other major concerns.  Additionally, your lipase, and enzyme associated with your pancreas, was mildly elevated today.  This should be retested and reevaluated through a primary care provider or gastroenterologist. In some cases, elevation can be associated with serious illness in the upper abdomen, however, outpatient follow up and further assessment is appropriate given the current lack of symptoms in the region.

## 2020-10-26 NOTE — ED Triage Notes (Addendum)
Patient c/o right chest pain since yesterday. Patient states when he takes deep breaths the pain worsens. Patient has a history of PE and DVT.

## 2020-10-26 NOTE — ED Provider Notes (Signed)
[facility] DEPT Provider Note   CSN: [id number] Arrival date & time: 10/26/20  1158     History Chief Complaint  Patient presents with  . Chest Pain    Glenn Conrad is a 40 y.o. male.  HPI  HPI: A 40 year old patient with a history of treated diabetes, hypertension, hypercholesterolemia and obesity presents for evaluation of chest pain. Initial onset of pain was approximately 1-3 hours ago. The patient's chest pain is sharp and is not worse with exertion. The patient's chest pain is not middle- or left-sided, is not well-localized, is not described as heaviness/pressure/tightness and does not radiate to the arms/jaw/neck. The patient does not complain of nausea and denies diaphoresis. The patient has no history of stroke, has no history of peripheral artery disease, has not smoked in the past 90 days and has no relevant family history of coronary artery disease (first degree relative at less than age 62).    Glenn Conrad is a 40 y.o. male, with a history of DM, hyperlipidemia, HTN, PE, DVT, presenting to the ED with chest pain beginning yesterday.  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  Accompanied by shortness of breath.  When chest discomfort resolves sensation of shortness of breath also resolves.  When asked the patient if this pain is similar to his previous instance of PE, he states, "I am not sure.  I am not as short of breath as I was then, but the pain might be similar." Voices compliance with his Xarelto.  He notes his dose of Xarelto was decreased from 20 mg daily to 10 mg daily a couple months ago.  They were also unable to determine the cause of the patient's original PE.  Denies fever/chills, dizziness, syncope, N/V/D, cough, lower extremity edema/pain, falls/trauma, diaphoresis, or any other  complaints.  Past Medical History:  Diagnosis Date  . Diabetes mellitus without complication ([place])   . Hyperlipidemia   . Hypertension   . PE (pulmonary thromboembolism) ([place])   . Right leg DVT ([place])   . Thyroid disease     Patient Active Problem List   Diagnosis Date Noted  . Acute pulmonary embolus ([place]) 12/07/2019  . Hypertension   . Current smoker     Past Surgical History:  Procedure Laterality Date  . WISDOM TOOTH EXTRACTION         Family History  Problem Relation Age of Onset  . Diabetes Mother   . Hypertension Mother   . Hypertension Father   . Diabetes Father     Social History   Tobacco Use  . Smoking status: Current Every Day Smoker    Packs/day: 0.50  . Smokeless tobacco: Never Used  Vaping Use  . Vaping Use: Never used  Substance Use Topics  . Alcohol use: No  . Drug use: No    Home Medications Prior to Admission medications   Medication Sig Start Date End Date Taking? Authorizing Provider  allopurinol (ZYLOPRIM) 100 MG tablet Take 1 tablet (100 mg total) by mouth daily. 10/20/20 01/18/21  [name], NP  amLODipine (NORVASC) 5 MG tablet Take 1 tablet (5 mg total) by mouth daily. To lower blood pressure 10/20/20 02/17/21  [name], NP  atorvastatin (LIPITOR) 20 MG tablet Take 1 tablet (20 mg total) by mouth daily. 10/20/20   [name], NP  Blood Glucose Monitoring Suppl (TRUE METRIX METER)  w/Device KIT Use as instructed. Check blood glucose level by fingerstick twice per day.  E11.65 01/14/20   [name], NP  colchicine 0.6 MG tablet Day 1: Take 0.6 mg 3 times daily on the first day of flare. Initiate as soon as possible, ideally within 12 to 24 hours of flare onset. Day 2 and thereafter:Take 0.6 mg once or twice daily until flare resolves then continue for 2 to 3 days after flare resolves 04/29/20   [name], NP  empagliflozin (JARDIANCE) 10 MG TABS tablet Take 1 tablet (10 mg total) by mouth daily before breakfast. 10/20/20  01/18/21  [name], NP  fluticasone (FLONASE) 50 MCG/ACT nasal spray Place 1 spray into both nostrils daily. 06/21/20   [name], NP  glucose blood (TRUE METRIX BLOOD GLUCOSE TEST) test strip Use as instructed. Check blood glucose level by fingerstick twice per day. 10/20/20   [name], NP  levothyroxine (SYNTHROID) 100 MCG tablet Take 1 tablet (100 mcg total) by mouth daily. 10/20/20 01/18/21  [name], NP  lidocaine (LIDODERM) 5 % Place 1 patch onto the skin daily. 09/03/20   [name], NP  methocarbamol (ROBAXIN) 500 MG tablet Take 1 tablet (500 mg total) by mouth every 8 (eight) hours as needed for muscle spasms. 12/15/19   Fulp, Cammie, MD  rivaroxaban (XARELTO) 10 MG TABS tablet Take 1 tablet (10 mg total) by mouth daily. 06/17/20   Orson Slick, MD  TRUEplus Lancets 28G MISC Use as instructed. Check blood glucose level by fingerstick twice per day. 10/20/20   [name], NP    Allergies    Glipizide, Lisinopril, and Metformin and related  Review of Systems   Review of Systems  Constitutional: Negative for chills, diaphoresis and fever.  Respiratory: Positive for shortness of breath. Negative for cough.   Cardiovascular: Positive for chest pain. Negative for leg swelling.  Gastrointestinal: Negative for diarrhea, nausea and vomiting.  Musculoskeletal: Negative for back pain.  Neurological: Negative for dizziness, syncope and weakness.  All other systems reviewed and are negative.   Physical Exam Updated Vital Signs BP 119/77 (BP Location: Left Arm)   Pulse (!) 55   Temp 98.2 F (36.8 C) (Oral)   Resp 16   Ht _0  (1.88 m)   Wt 121.5 kg   SpO2 99%   BMI 34.38 kg/m   Physical Exam Vitals and nursing note reviewed.  Constitutional:      General: He is not in acute distress.    Appearance: He is well-developed. He is not diaphoretic.  HENT:     Head: Normocephalic and atraumatic.     Mouth/Throat:     Mouth: Mucous membranes are  moist.     Pharynx: Oropharynx is clear.  Eyes:     Conjunctiva/sclera: Conjunctivae normal.  Cardiovascular:     Rate and Rhythm: Normal rate and regular rhythm.     Pulses: Normal pulses.          Radial pulses are 2+ on the right side and 2+ on the left side.       Posterior tibial pulses are 2+ on the right side and 2+ on the left side.     Heart sounds: Normal heart sounds.     Comments: Tactile temperature in the extremities appropriate and equal bilaterally. Pulmonary:     Effort: Pulmonary effort is normal. No respiratory distress.     Breath sounds: Normal breath sounds.  Chest:  Chest wall: No tenderness.     Comments: When patient gestures to the location of his pain, he indicates the anterior right chest, but also the right lower lateral chest as well. Abdominal:     Palpations: Abdomen is soft.     Tenderness: There is no abdominal tenderness. There is no guarding.  Musculoskeletal:     Cervical back: Neck supple.     Right lower leg: No edema.     Left lower leg: No edema.  Skin:    General: Skin is warm and dry.  Neurological:     Mental Status: He is alert.  Psychiatric:        Mood and Affect: Mood and affect normal.        Speech: Speech normal.        Behavior: Behavior normal.     ED Results / Procedures / Treatments   Labs (all labs ordered are listed, but only abnormal results are displayed) Labs Reviewed  HEPATIC FUNCTION PANEL - Abnormal; Notable for the following components:      Result Value   ALT 46 (*)    All other components within normal limits  LIPASE, BLOOD - Abnormal; Notable for the following components:   Lipase 71 (*)    All other components within normal limits  BASIC METABOLIC PANEL  CBC  TROPONIN I (HIGH SENSITIVITY)  TROPONIN I (HIGH SENSITIVITY)    EKG EKG Interpretation  Date/Time:  Tuesday October 26 2020 12:11:43 EDT Ventricular Rate:  53 PR Interval:    QRS Duration: 94 QT Interval:  399 QTC Calculation: 375 R  Axis:   68 Text Interpretation: Sinus rhythm Anterior infarct, old Nonspecific T abnormalities, inferior leads Baseline wander in lead(s) II aVR 12 Lead; [place] No significant change since last tracing Confirmed by Gareth Morgan [phone]) on 10/26/2020 3:53:11 PM   Radiology DG Chest 2 View  Result Date: 10/26/2020 CLINICAL DATA:  Right-sided chest pain. EXAM: CHEST - 2 VIEW COMPARISON:  12/06/2019 FINDINGS: The heart size and mediastinal contours are within normal limits. There is no evidence of pulmonary edema, consolidation, pneumothorax, nodule or pleural fluid. The visualized skeletal structures are unremarkable. IMPRESSION: No active cardiopulmonary disease. Electronically Signed   By: Aletta Edouard M.D.   On: 10/26/2020 12:33   CT Angio Chest PE W and/or Wo Contrast  Result Date: 10/26/2020 CLINICAL DATA:  Right-sided chest pain. EXAM: CT ANGIOGRAPHY CHEST WITH CONTRAST TECHNIQUE: Multidetector CT imaging of the chest was performed using the standard protocol during bolus administration of intravenous contrast. Multiplanar CT image reconstructions and MIPs were obtained to evaluate the vascular anatomy. CONTRAST:  36m OMNIPAQUE IOHEXOL 350 MG/ML SOLN COMPARISON:  12/07/1999 FINDINGS: Cardiovascular: The heart size is normal. No substantial pericardial effusion. No thoracic aortic aneurysm. No large central pulmonary embolus. No evidence for pulmonary embolic disease to the upper lobes. No lobar or segmental pulmonary embolus to the lower lobes. Distal segmental and subsegmental pulmonary arteries in the lower lobes show no definite filling defects although assessment is degraded secondary to breathing motion. Mediastinum/Nodes: Stable 10 mm short axis AP window lymph node. 14 mm short axis right hilar lymph node is unchanged. No other mediastinal or hilar lymphadenopathy. The esophagus has normal imaging features. Insert normal axillary Lungs/Pleura: Centrilobular and paraseptal emphysema  evident. No suspicious pulmonary nodule or mass. No focal airspace consolidation. There is no evidence of pleural effusion. Upper Abdomen: Unremarkable. Musculoskeletal: No worrisome lytic or sclerotic osseous abnormality. Review of the MIP images confirms the above  findings. IMPRESSION: 1. No CT evidence for acute pulmonary embolus. Distal segmental and subsegmental pulmonary arteries in the lower lobes show no definite filling defects although assessment may not be reliable due to breathing motion. 2. Stable mild lymphadenopathy in the right hilum and AP window, likely reactive. 3. Emphysema (ICD10-J43.9). Electronically Signed   By: Misty Stanley M.D.   On: 10/26/2020 15:56    Procedures Procedures   Medications Ordered in ED Medications  iohexol (OMNIPAQUE) 350 MG/ML injection 75 mL (75 mLs Intravenous Contrast Given 10/26/20 1515)    ED Course  I have reviewed the triage vital signs and the nursing notes.  Pertinent labs & imaging results that were available during my care of the patient were reviewed by me and considered in my medical decision making (see chart for details).  Clinical Course as of 10/26/20 1739  Tue Oct 26, 2020  1621 Lipase(!): 71 Denies epigastric pain; No epigastric or abdominal tenderness. No jaundice or weight loss. [SJ]  1728 Pulse Rate(!): 47 Patient symptom-free despite mild bradycardia.  EKG shows sinus bradycardia rather than arrhythmia, such as high-level AV block. [SJ]    Clinical Course User Index [SJ] Darren Caldron, Helane Gunther, PA-C   MDM Rules/Calculators/A&P HEAR Score: 3                        Patient presents with intermittent episodes of chest pain. Patient is nontoxic appearing, afebrile, not tachycardic, not tachypneic, not hypotensive, maintains excellent SPO2 on room air, and is in no apparent distress.   I have reviewed the patient's chart to obtain more information.   I reviewed and interpreted the patient's labs and radiological studies. Mild  lipase elevation.  PCP follow-up for further assessment and retesting.  HEART score is 3, indicating low risk for a cardiac event.  EKG without evidence of acute ischemia or pathologic/symptomatic arrhythmia. CTA without evidence of recurrent PE. Dissection was considered, but thought less likely base on: History and description of the pain are not suggestive, patient is not ill-appearing, lack of risk factors, equal bilateral pulses, lack of neurologic deficits, no widened mediastinum on chest x-ray.  The patient was given instructions for home care as well as return precautions. Patient voices understanding of these instructions, accepts the plan, and is comfortable with discharge.     Final Clinical Impression(s) / ED Diagnoses Final diagnoses:  Atypical chest pain  Elevated lipase    Rx / DC Orders ED Discharge Orders    None       Layla Maw 10/26/20 1739    Gareth Morgan, MD 10/29/20 [phone]

## 2020-10-26 NOTE — ED Notes (Signed)
Patient has removed BP cuff, heart monitoring and pulse ox

## 2020-11-01 ENCOUNTER — Other Ambulatory Visit: Payer: Self-pay

## 2020-11-01 ENCOUNTER — Ambulatory Visit: Payer: Self-pay | Attending: Nurse Practitioner

## 2020-11-01 DIAGNOSIS — E039 Hypothyroidism, unspecified: Secondary | ICD-10-CM

## 2020-11-01 DIAGNOSIS — Z1159 Encounter for screening for other viral diseases: Secondary | ICD-10-CM

## 2020-11-01 DIAGNOSIS — E785 Hyperlipidemia, unspecified: Secondary | ICD-10-CM

## 2020-11-01 DIAGNOSIS — I10 Essential (primary) hypertension: Secondary | ICD-10-CM

## 2020-11-01 MED FILL — XARELTO 10 MG TABLET: 10 | 30 days supply | Qty: 30 | Fill #2

## 2020-11-01 MED FILL — AMLODIPINE BESYLATE 5 MG TA: 5 | 30 days supply | Qty: 30 | Fill #0

## 2020-11-02 LAB — CMP14+EGFR
ALT: 55 IU/L — ABNORMAL HIGH (ref 0–44)
AST: 27 IU/L (ref 0–40)
Albumin/Globulin Ratio: 1.5 (ref 1.2–2.2)
Albumin: 4.4 g/dL (ref 4.0–5.0)
Alkaline Phosphatase: 68 IU/L (ref 44–121)
BUN/Creatinine Ratio: 11 (ref 9–20)
BUN: 11 mg/dL (ref 6–24)
Bilirubin Total: 0.5 mg/dL (ref 0.0–1.2)
CO2: 25 mmol/L (ref 20–29)
Calcium: 9.8 mg/dL (ref 8.7–10.2)
Chloride: 101 mmol/L (ref 96–106)
Creatinine, Ser: 1.03 mg/dL (ref 0.76–1.27)
Globulin, Total: 3 g/dL (ref 1.5–4.5)
Glucose: 81 mg/dL (ref 65–99)
Potassium: 4 mmol/L (ref 3.5–5.2)
Sodium: 143 mmol/L (ref 134–144)
Total Protein: 7.4 g/dL (ref 6.0–8.5)
eGFR: 94 mL/min/{1.73_m2} (ref >59)

## 2020-11-02 LAB — LIPID PANEL
Chol/HDL Ratio: 2.2 ratio (ref 0.0–5.0)
Cholesterol, Total: 107 mg/dL (ref 100–199)
HDL: 48 mg/dL (ref >39)
LDL Chol Calc (NIH): 45 mg/dL (ref 0–99)
Triglycerides: 65 mg/dL (ref 0–149)
VLDL Cholesterol Cal: 14 mg/dL (ref 5–40)

## 2020-11-02 LAB — HCV AB W REFLEX TO QUANT PCR: HCV Ab: <0.1 s/co ratio (ref 0.0–0.9)

## 2020-11-02 LAB — TSH: TSH: 5.68 u[IU]/mL — ABNORMAL HIGH (ref 0.450–4.500)

## 2020-11-02 LAB — HCV INTERPRETATION

## 2020-11-03 NOTE — Progress Notes (Signed)
Cardiology Office Note:   Date:  11/04/2020  NAME:  Glenn Conrad    MRN: [id number] DOB:  1980-10-22   PCP:  [name], NP  Cardiologist:  No primary care provider on file.  Electrophysiologist:  None   Referring MD: [name], NP   Chief Complaint  Patient presents with   Chest Pain   History of Present Illness:   Glenn Conrad is a 40 y.o. male with a hx of obesity, DVT/PE, HTN, HLD, DM who is being seen today for the evaluation of chest pain at the request of [name], NP. Seen in ER recently for CP. Ruled out for ACS.  He was seen in the emergency room for a constellation of symptoms.  He reports a sharp pain in his right flank.  The pain then went into his right chest and right neck.  He reports since that time he is also had left neck tightness.  Pain described as in the left neck and left chest.  Worsened by movement of his neck.  He reports when he looks over his left shoulder his symptoms are worsened.  He describes as a sharp and pinching pain.  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  I suspect this is just muscle spasm.  He reports no exertional symptoms.  The symptoms occur at rest.  They can occur at any time.  He was not given a prescription for ibuprofen due to his Xarelto needs.  He describes no exertional shortness of breath or chest pain symptoms.  He is a current everyday smoker.  He smoked for 25 years.  Chest CT showed evidence of emphysema.  Smoking cessation was advised.  He also has hypertension and diabetes.  BP 120/66 today in office.  Recent diabetes shows good control with an A1c of 6.3.  His TSH is a bit elevated.  I suspect he needs adjustment in his Synthroid dose.  Family history is significant for diabetes and hypertension.  He also had an unprovoked PE in the past.  He remains on low-dose Xarelto for prophylaxis.  Symptoms are slowly improving but not back to baseline.   Again they occur with neck position.  Problem List 1. DM -A1c 6.3 2. HTN 3. 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 COPD? 6. Obesity  -BMI 34  Past Medical History: Past Medical History:  Diagnosis Date   Diabetes mellitus without complication ([facility])    Hyperlipidemia    Hypertension    PE (pulmonary thromboembolism) ([place])    Right leg DVT ([facility])    Thyroid disease     Past Surgical History: Past Surgical History:  Procedure Laterality Date   WISDOM TOOTH EXTRACTION      Current Medications: Current Meds  Medication Sig   allopurinol (ZYLOPRIM) 100 MG tablet Take 1 tablet (100 mg total) by mouth daily.   amLODipine (NORVASC) 5 MG tablet Take 1 tablet (5 mg total) by mouth daily. To lower blood pressure   atorvastatin (LIPITOR) 20 MG tablet Take 1 tablet (20 mg total) by mouth daily.   baclofen (LIORESAL) 10 MG tablet Take 1 tablet (10 mg total) by mouth 3 (three) times daily as needed for muscle spasms.   Blood Glucose Monitoring Suppl (TRUE METRIX METER) w/Device KIT Use as instructed. Check blood glucose level by fingerstick twice per day.  E11.65   colchicine 0.6 MG tablet Day 1: Take 0.6 mg 3 times daily on the first day of flare. Initiate as soon as possible, ideally within 12 to 24 hours of flare onset. Day 2 and thereafter:Take 0.6 mg once or twice daily until flare resolves then continue for 2 to 3 days after flare resolves   empagliflozin (JARDIANCE) 10 MG TABS tablet Take 1 tablet (10 mg total) by mouth daily before breakfast.   fluticasone (FLONASE) 50 MCG/ACT nasal spray Place 1 spray into both nostrils daily.   glucose blood (TRUE METRIX BLOOD GLUCOSE TEST) test strip Use as instructed. Check blood glucose level by fingerstick twice per day.   ibuprofen (ADVIL) 800 MG tablet Take 1 tablet (800 mg total) by mouth 3 (three) times daily for 7 days.   levothyroxine (SYNTHROID) 100 MCG tablet Take 1  tablet (100 mcg total) by mouth daily.   lidocaine (LIDODERM) 5 % Place 1 patch onto the skin daily.   methocarbamol (ROBAXIN) 500 MG tablet Take 1 tablet (500 mg total) by mouth every 8 (eight) hours as needed for muscle spasms.   rivaroxaban (XARELTO) 10 MG TABS tablet Take 1 tablet (10 mg total) by mouth daily.   TRUEplus Lancets 28G MISC Use as instructed. Check blood glucose level by fingerstick twice per day.     Allergies:    Glipizide, Lisinopril, and Metformin and related   Social History: Social History   Socioeconomic History   Marital status: Single    Spouse name: Not on file   Number of children: Not on file   Years of education: Not on file   Highest education level: Not on file  Occupational History   Not on file  Tobacco Use   Smoking status: Current Every Day Smoker    Packs/day: 0.50    Years: 25.00    Pack years: 12.50   Smokeless tobacco: Never Used  Scientific laboratory technician Use: Never used  Substance and Sexual Activity   Alcohol use: No   Drug use: No   Sexual activity: Yes  Other Topics Concern   Not on file  Social History Narrative   Not on file   Social Determinants of Health   Financial Resource Strain: Not on file  Food Insecurity: Not on file  Transportation Needs: Not on file  Physical Activity: Not on file  Stress: Not on file  Social Connections: Not on file     Family History: The patient's family history includes Diabetes in his father and mother; Hypertension in his father and mother.  ROS:   All other ROS reviewed and negative. Pertinent positives noted in the HPI.     EKGs/Labs/Other Studies Reviewed:   The following studies were personally reviewed by me today:  EKG:  EKG is ordered today.  The ekg ordered today demonstrates sinus bradycardia heart rate 50, nonspecific ST-T changes, and was personally reviewed by me.  TTE 12/07/2019 1. Left ventricular ejection fraction, by estimation, is 60 to 65%. The   left ventricle has normal function. The left ventricle has no regional  wall motion abnormalities. There is moderate left ventricular hypertrophy.  Left ventricular diastolic  parameters were normal.  2. Right ventricular systolic function is mildly reduced. The right  ventricular size is mildly enlarged.  3. The mitral valve is normal in structure. No evidence of mitral valve  regurgitation. No evidence of mitral stenosis.  4. The aortic valve is normal in structure. Aortic valve regurgitation is  not visualized. No aortic stenosis is present.  5. The inferior vena cava is normal in size with greater than 50%  respiratory variability, suggesting right atrial pressure of 3 mmHg.    Recent Labs: 12/07/2019: B Natriuretic Peptide 28.4 10/26/2020: Hemoglobin 14.0; Platelets 193 11/01/2020: ALT 55; BUN 11; Creatinine, Ser 1.03; Potassium 4.0; Sodium 143; TSH 5.680   Recent Lipid Panel    Component Value Date/Time   CHOL 107 11/01/2020 1559   TRIG 65 11/01/2020 1559   HDL 48 11/01/2020 1559   CHOLHDL 2.2 11/01/2020 1559   LDLCALC 45 11/01/2020 1559    Physical Exam:   VS:  BP 120/66 (BP Location: Left Arm, Patient Position: Sitting)    Pulse (!) 50    Ht 6' 1.5" (1.867 m)    Wt 258 lb 9.6 oz (117.3 kg)    SpO2 93%    BMI 33.66 kg/m    Wt Readings from Last 3 Encounters:  11/04/20 258 lb 9.6 oz (117.3 kg)  10/26/20 267 lb 12.8 oz (121.5 kg)  06/25/20 280 lb (127 kg)    General: Well nourished, well developed, in no acute distress Head: Atraumatic, normal size  Eyes: PEERLA, EOMI  Neck: No JVD, tenderness to palpation over the left neck area Endocrine: No thryomegaly Cardiac: Normal S1, S2; RRR; no murmurs, rubs, or gallops Lungs: Clear to auscultation bilaterally, no wheezing, rhonchi or rales  Abd: Soft, nontender, no hepatomegaly  Ext: No edema, pulses 2+ Musculoskeletal: No deformities, BUE and BLE strength normal and equal Skin: Warm and dry, no rashes   Neuro: Alert  and oriented to person, place, time, and situation, CNII-XII grossly intact, no focal deficits  Psych: Normal mood and affect   ASSESSMENT:   Glenn Conrad is a 40 y.o. male who presents for the following: 1. Strain of neck muscle, initial encounter   2. Chest pain, unspecified type   3. Primary hypertension   4. Tobacco abuse     PLAN:   1. Strain of neck muscle, initial encounter 2. Chest pain, unspecified type -Evaluated the emergency room for atypical chest pain.  Started off as right flank pain.  Went into the right chest and right neck.  Has continued to have tightness and stiffness in his left neck.  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  His EKG shows normal sinus rhythm with nonspecific ST-T changes which I suspect her blood pressure related.  He has no exertional chest pain or pressure concerning for angina.  This appears to be a simple neck strain.  I recommended a short course of baclofen as well as ibuprofen 800 mg 3 times daily for 7 days.  He should drink plenty water with this.  His recent chest CT PE study shows no evidence of pulmonary embolism.  He has no evidence of coronary calcium.  I see no need for further cardiac testing.  He will see [place] as needed.  3. Primary hypertension -Well-controlled on current medications.  4. Tobacco abuse -Smoking cessation was advised.  He has evidence of emphysema on CT.  No symptoms of this.  I think quitting would be in his best interest.  Disposition: Return if symptoms worsen or fail to improve.  Medication Adjustments/Labs and Tests Ordered: Current medicines are reviewed at length with the patient today.  Concerns regarding medicines are outlined above.  Orders Placed This Encounter  Procedures   EKG 12-Lead   Meds ordered this encounter  Medications   ibuprofen (ADVIL) 800 MG  tablet    Sig: Take 1 tablet (800 mg total) by mouth 3 (three) times daily for 7 days.    Dispense:  21 tablet    Refill:  0   baclofen  (LIORESAL) 10 MG tablet    Sig: Take 1 tablet (10 mg total) by mouth 3 (three) times daily as needed for muscle spasms.    Dispense:  15 each    Refill:  0    Patient Instructions  Medication Instructions:  Start Ibuprofen 800 mg three times daily for 7 days.  Start Baclofen 10 mg three times daily as needed for spasm   *If you need a refill on your cardiac medications before your next appointment, please call your pharmacy*  Follow-Up: At [facility], you and your health needs are our priority.  As part of our continuing mission to provide you with exceptional heart care, we have created designated Provider Care Teams.  These Care Teams include your primary Cardiologist (physician) and Advanced Practice Providers (APPs -  Physician Assistants and Nurse Practitioners) who all work together to provide you with the care you need, when you need it.  We recommend signing up for the patient portal called "MyChart".  Sign up information is provided on this After Visit Summary.  MyChart is used to connect with patients for Virtual Visits (Telemedicine).  Patients are able to view lab/test results, encounter notes, upcoming appointments, etc.  Non-urgent messages can be sent to your provider as well.   To learn more about what you can do with MyChart, go to [email or web address].    Your next appointment:   As needed  The format for your next appointment:   In Person  Provider:   Eleonore Chiquito, MD       Signed, Addison Naegeli. Audie Box, MD, [place]  [address], [place], [place] [address] [phone]  11/04/2020 12:50 PM

## 2020-11-04 ENCOUNTER — Other Ambulatory Visit: Payer: Self-pay | Admitting: Cardiovascular Disease

## 2020-11-04 ENCOUNTER — Encounter: Payer: Self-pay | Admitting: Nurse Practitioner

## 2020-11-04 ENCOUNTER — Encounter: Payer: Self-pay | Admitting: Cardiovascular Disease

## 2020-11-04 ENCOUNTER — Ambulatory Visit (INDEPENDENT_AMBULATORY_CARE_PROVIDER_SITE_OTHER): Payer: Self-pay | Admitting: Cardiovascular Disease

## 2020-11-04 ENCOUNTER — Other Ambulatory Visit: Payer: Self-pay

## 2020-11-04 VITALS — BP 120/66 | HR 50 | Ht 73.5 in | Wt 258.6 lb

## 2020-11-04 DIAGNOSIS — S161XXA Strain of muscle, fascia and tendon at neck level, initial encounter: Secondary | ICD-10-CM

## 2020-11-04 DIAGNOSIS — R079 Chest pain, unspecified: Secondary | ICD-10-CM

## 2020-11-04 DIAGNOSIS — I10 Essential (primary) hypertension: Secondary | ICD-10-CM

## 2020-11-04 DIAGNOSIS — Z720 Tobacco use: Secondary | ICD-10-CM

## 2020-11-04 MED ORDER — BACLOFEN 10 MG PO TABS: 10.0000 mg | ORAL_TABLET | Freq: Three times a day (TID) | ORAL | 0 refills | Status: DC | PRN

## 2020-11-04 MED ORDER — IBUPROFEN 800 MG PO TABS: 800.0000 mg | ORAL_TABLET | Freq: Three times a day (TID) | ORAL | 0 refills | Status: DC

## 2020-11-04 MED FILL — ATORVASTATIN CALCIUM 20 MG: 20 | 30 days supply | Qty: 30 | Fill #6

## 2020-11-04 MED FILL — IBUPROFEN 800 MG TABLET: 800 | 7 days supply | Qty: 21 | Fill #0

## 2020-11-04 MED FILL — BACLOFEN 10 MG TABLET: 10 | 5 days supply | Qty: 15 | Fill #0

## 2020-11-04 NOTE — Patient Instructions (Signed)
Medication Instructions:  Start Ibuprofen 800 mg three times daily for 7 days.  Start Baclofen 10 mg three times daily as needed for spasm   *If you need a refill on your cardiac medications before your next appointment, please call your pharmacy*  Follow-Up: At [facility], you and your health needs are our priority.  As part of our continuing mission to provide you with exceptional heart care, we have created designated Provider Care Teams.  These Care Teams include your primary Cardiologist (physician) and Advanced Practice Providers (APPs -  Physician Assistants and Nurse Practitioners) who all work together to provide you with the care you need, when you need it.  We recommend signing up for the patient portal called "MyChart".  Sign up information is provided on this After Visit Summary.  MyChart is used to connect with patients for Virtual Visits (Telemedicine).  Patients are able to view lab/test results, encounter notes, upcoming appointments, etc.  Non-urgent messages can be sent to your provider as well.   To learn more about what you can do with MyChart, go to [email or web address].    Your next appointment:   As needed  The format for your next appointment:   In Person  Provider:   Lennie Odor, MD

## 2020-11-08 ENCOUNTER — Encounter: Payer: Self-pay | Admitting: Family

## 2020-11-08 NOTE — Progress Notes (Signed)
Recent appointment with Cardiology on 11/04/2020 for today's chief complaint. Patient confirmed plan with his primary provider on the same day.

## 2020-11-12 MED FILL — ALLOPURINOL 100 MG TABLET: 100 | 30 days supply | Qty: 30 | Fill #0

## 2020-11-12 MED FILL — LEVOTHYROXINE SODIUM 100 MC: 100 | 30 days supply | Qty: 30 | Fill #0

## 2020-11-13 ENCOUNTER — Other Ambulatory Visit: Payer: Self-pay

## 2020-11-28 MED FILL — Amlodipine Besylate Tab 5 MG (Base Equivalent): ORAL | 30 days supply | Qty: 30 | Fill #0 | Status: AC

## 2020-11-28 MED FILL — Rivaroxaban Tab 10 MG: ORAL | 30 days supply | Qty: 30 | Fill #0 | Status: AC

## 2020-11-29 ENCOUNTER — Other Ambulatory Visit: Payer: Self-pay

## 2020-12-08 MED FILL — Allopurinol Tab 100 MG: ORAL | 30 days supply | Qty: 30 | Fill #0 | Status: AC

## 2020-12-08 MED FILL — Levothyroxine Sodium Tab 100 MCG: ORAL | 30 days supply | Qty: 30 | Fill #0 | Status: AC

## 2020-12-08 MED FILL — Atorvastatin Calcium Tab 20 MG (Base Equivalent): ORAL | 30 days supply | Qty: 30 | Fill #0 | Status: AC

## 2020-12-09 ENCOUNTER — Other Ambulatory Visit: Payer: Self-pay

## 2020-12-21 ENCOUNTER — Other Ambulatory Visit: Payer: Self-pay | Admitting: Hematology and Oncology

## 2020-12-21 DIAGNOSIS — I82461 Acute embolism and thrombosis of right calf muscular vein: Secondary | ICD-10-CM

## 2020-12-21 NOTE — Progress Notes (Signed)
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 CT PE study showed acute pulmonary infarction in the right lung, most severe in the right lower lobe where there is an occlusive embolus and probable area of pulmonary hemorrhage in the posterior aspect of the right lower lobe from pulmonary infarction. 1) 12/08/2019:Bilateral LE US showed findings consistent with acute deep vein thrombosis involving the right  gastrocnemius veins. No DVT noted in the left leg. Started on Xarelto therapy.  2) 01/02/2020: establish care with Dr. Lorenso Courier   Interval History:  Glenn Conrad 40 y.o. male with medical history significant for Pulmonary Embolism/ Right Lower Extremity DVT presents for a follow up visit. The patient's last visit was on 06/17/2020. In the interim since the last visit he had no major changes in his health, though he did have an emergency room visit for chest pain the etiology which could not be clearly discerned.  He did have a CTA study which was negative.  On exam today Glenn Conrad notes he has been well overall in the interim since our last visit.  He has not been having any issues with bleeding, or dark stools.  He does have easy bruising, but no large bruises are present on him today.  He is tolerating Xarelto 10 mg p.o. daily well otherwise and is not having any financial issues with this medication.  He currently denies having any issues with fevers, chills, sweats, nausea, vomiting or diarrhea.  A full 10 point ROS is listed below.  MEDICAL HISTORY:  Past Medical History:  Diagnosis Date  . Diabetes mellitus without complication ([place])   . Hyperlipidemia   . Hypertension   . PE (pulmonary  thromboembolism) ([place])   . Right leg DVT ([place])   . Thyroid disease     SURGICAL HISTORY: Past Surgical History:  Procedure Laterality Date  . WISDOM TOOTH EXTRACTION      SOCIAL HISTORY: Social History   Socioeconomic History  . Marital status: Single    Spouse name: Not on file  . Number of children: Not on file  . Years of education: Not on file  . Highest education level: Not on file  Occupational History  . Not on file  Tobacco Use  . Smoking status: Current Every Day Smoker    Packs/day: 0.50    Years: 25.00    Pack years: 12.50  . Smokeless tobacco: Never Used  Vaping Use  . Vaping Use: Never used  Substance and Sexual Activity  . Alcohol use: No  . Drug use: No  . Sexual activity: Yes  Other Topics Concern  . Not on file  Social History Narrative  . Not on file   Social Determinants of Health   Financial Resource Strain: Not on file  Food Insecurity: Not on file  Transportation Needs: Not on file  Physical Activity: Not on file  Stress: Not on file  Social Connections: Not on file  Intimate Partner Violence: Not on file    FAMILY HISTORY: Family History  Problem Relation Age of Onset  . Diabetes Mother   . Hypertension Mother   . Hypertension Father   . Diabetes Father     ALLERGIES:  is allergic to glipizide, lisinopril, and metformin and related.  MEDICATIONS:  Current Outpatient Medications  Medication Sig Dispense Refill  . allopurinol (ZYLOPRIM) 100 MG tablet TAKE 1 TABLET (100 MG TOTAL) BY MOUTH DAILY. 90 tablet 1  . amLODipine (NORVASC) 5 MG tablet TAKE 1 TABLET (5 MG TOTAL) BY MOUTH DAILY. TO LOWER BLOOD PRESSURE 90 tablet 1  . atorvastatin (LIPITOR) 20 MG tablet TAKE 1 TABLET (20 MG TOTAL) BY MOUTH DAILY. 90 tablet 3  . baclofen (LIORESAL) 10 MG tablet TAKE 1 TABLET (10 MG TOTAL) BY MOUTH 3 (THREE) TIMES DAILY AS NEEDED FOR MUSCLE SPASMS. 15 tablet 0  . Blood Glucose Monitoring Suppl (TRUE METRIX METER) w/Device KIT Use as  instructed. Check blood glucose level by fingerstick twice per day.  E11.65 1 kit 0  . empagliflozin (JARDIANCE) 10 MG TABS tablet TAKE 1 TABLET (10 MG TOTAL) BY MOUTH DAILY BEFORE BREAKFAST. 90 tablet 1  . fluticasone (FLONASE) 50 MCG/ACT nasal spray Place 1 spray into both nostrils daily. 16 g 6  . glucose blood test strip USE AS INSTRUCTED. CHECK BLOOD GLUCOSE LEVEL BY FINGERSTICK TWICE PER DAY. (Patient taking differently: USE AS INSTRUCTED. CHECK BLOOD GLUCOSE LEVEL BY FINGERSTICK TWICE PER DAY.) 100 strip 12  . ibuprofen (ADVIL) 800 MG tablet TAKE 1 TABLET (800 MG TOTAL) BY MOUTH 3 (THREE) TIMES DAILY FOR 7 DAYS. 21 tablet 0  . levothyroxine (SYNTHROID) 100 MCG tablet TAKE 1 TABLET (100 MCG TOTAL) BY MOUTH DAILY. 90 tablet 1  . lidocaine (LIDODERM) 5 % PLACE 1 PATCH ONTO THE SKIN DAILY. 30 patch 0  . rivaroxaban (XARELTO) 10 MG TABS tablet TAKE 1 TABLET (10 MG TOTAL) BY MOUTH DAILY. 90 tablet 3  . TRUEplus Lancets 28G MISC USE AS INSTRUCTED. CHECK BLOOD GLUCOSE LEVEL BY FINGERSTICK TWICE PER DAY. 100 each 3  . colchicine 0.6 MG tablet Day 1: Take 0.6 mg 3 times daily on the first day of flare. Initiate as soon as possible, ideally within 12 to 24 hours of flare onset. Day 2 and thereafter:Take 0.6 mg once or twice daily until flare resolves then continue for 2 to 3 days after flare resolves (Patient not taking: Reported on 12/22/2020) 30 tablet 0   No current facility-administered medications for this visit.    REVIEW OF SYSTEMS:   Constitutional: ( - ) fevers, ( - )  chills , ( - ) night sweats Eyes: ( - ) blurriness of vision, ( - ) double vision, ( - ) watery eyes Ears, nose, mouth, throat, and face: ( - ) mucositis, ( - ) sore throat Respiratory: ( - ) cough, ( - ) dyspnea, ( - ) wheezes Cardiovascular: ( - ) palpitation, ( - ) chest discomfort, ( - ) lower extremity swelling Gastrointestinal:  ( - ) nausea, ( - ) heartburn, ( - ) change in bowel habits Skin: ( - ) abnormal skin  rashes Lymphatics: ( - ) new lymphadenopathy, ( - ) easy bruising Neurological: ( - ) numbness, ( - ) tingling, ( - ) new weaknesses Behavioral/Psych: ( - ) mood change, ( - ) new changes  All other systems were reviewed with the patient and are negative.  PHYSICAL EXAMINATION: ECOG PERFORMANCE STATUS: 0 - Asymptomatic  Vitals:   12/22/20 0822  BP: 131/67  Pulse: (!) 51  Resp: 20  Temp: 97.6 F (36.4 C)  SpO2: 100%   Filed Weights   12/22/20 0822  Weight: 258 lb 6.4 oz (117.2 kg)    GENERAL: well appearing middle aged African-American male in no acute distress. alert,  no distress and comfortable SKIN: skin color, texture, turgor are normal, no rashes or significant lesions EYES: conjunctiva are pink and non-injected, sclera clear LUNGS: clear to auscultation and percussion with normal breathing effort HEART: regular rate & rhythm and no murmurs and no lower extremity edema Musculoskeletal: no cyanosis of digits and no clubbing  PSYCH: alert & oriented x 3, fluent speech NEURO: no focal motor/sensory deficits  LABORATORY DATA:  I have reviewed the data as listed CBC Latest Ref Rng & Units 12/22/2020 10/26/2020 06/17/2020  WBC 4.0 - 10.5 K/uL 4.6 5.5 5.4  Hemoglobin 13.0 - 17.0 g/dL 14.1 14.0 13.4  Hematocrit 39.0 - 52.0 % 42.6 43.5 40.9  Platelets 150 - 400 K/uL 177 193 174    CMP Latest Ref Rng & Units 12/22/2020 11/01/2020 10/26/2020  Glucose 70 - 99 mg/dL 103(H) 81 93  BUN 6 - 20 mg/dL 12 11 14   Creatinine 0.61 - 1.24 mg/dL 1.01 1.03 1.12  Sodium 135 - 145 mmol/L 142 143 142  Potassium 3.5 - 5.1 mmol/L 3.9 4.0 4.0  Chloride 98 - 111 mmol/L 102 101 104  CO2 22 - 32 mmol/L 31 25 31   Calcium 8.9 - 10.3 mg/dL 9.7 9.8 9.5  Total Protein 6.5 - 8.1 g/dL 7.0 7.4 7.3  Total Bilirubin 0.3 - 1.2 mg/dL 0.8 0.5 0.6  Alkaline Phos 38 - 126 U/L 52 68 49  AST 15 - 41 U/L 25 27 23   ALT 0 - 44 U/L 48(H) 55(H) 46(H)    RADIOGRAPHIC STUDIES: No results found.  ASSESSMENT &  PLAN Glenn Conrad 40 y.o. male with medical history significant for Pulmonary Embolism/ Right Lower Extremity DVT presents for a follow up visit.   On exam today Glenn Conrad notes he has been compliant with his medication has had no bleeding, major bruising, or dark stools.  He is also having no clear evidence of recurrent VTE.  Previously we discussed decreasing the dose of his Xarelto from 20 mg p.o. daily down to 10 mg p.o. daily as he has completed a full 6 months of therapy.  After discussing the risks and benefits of this transition the patient was agreeable to transitioning down to 10 mg p.o. daily.  We will plan to see him back in approximately 6 months time to see how he is tolerating the treatment.  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  --decreased Protein S activity in the setting of acute thrombus is of little utility. Will need to recheck today --no clear evidence of a hypercoagulable disorder based on evaluation performed 12/07/2019.  --patient is agreeable to continuing 60m PO daily Xarelto --RTC in 6 months time to re-evaluate or sooner if new symptoms arise.   No orders of the defined types were placed in this encounter.   All questions were answered. The patient knows to call the clinic with any problems, questions or concerns.  A total of more than 30 minutes were spent on this encounter and over half of that time was spent on counseling and coordination of care as outlined above.   JLedell Peoples MD Department of Hematology/Oncology [facility]Gl[facility]at [facility], LLCPhone: [phone]: [phone]: [email or web address]  12/22/2020 10:09 AM

## 2020-12-22 ENCOUNTER — Inpatient Hospital Stay: Payer: Self-pay

## 2020-12-22 ENCOUNTER — Inpatient Hospital Stay: Payer: Self-pay | Attending: Hematology and Oncology | Admitting: Hematology and Oncology

## 2020-12-22 ENCOUNTER — Other Ambulatory Visit: Payer: Self-pay

## 2020-12-22 VITALS — BP 131/67 | HR 51 | Temp 97.6°F | Resp 20 | Ht 73.0 in | Wt 258.4 lb

## 2020-12-22 DIAGNOSIS — I82461 Acute embolism and thrombosis of right calf muscular vein: Secondary | ICD-10-CM

## 2020-12-22 DIAGNOSIS — I2609 Other pulmonary embolism with acute cor pulmonale: Secondary | ICD-10-CM

## 2020-12-22 DIAGNOSIS — Z86711 Personal history of pulmonary embolism: Secondary | ICD-10-CM | POA: Insufficient documentation

## 2020-12-22 DIAGNOSIS — Z86718 Personal history of other venous thrombosis and embolism: Secondary | ICD-10-CM | POA: Insufficient documentation

## 2020-12-22 DIAGNOSIS — Z79899 Other long term (current) drug therapy: Secondary | ICD-10-CM | POA: Insufficient documentation

## 2020-12-22 DIAGNOSIS — Z7901 Long term (current) use of anticoagulants: Secondary | ICD-10-CM | POA: Insufficient documentation

## 2020-12-22 LAB — CMP (CANCER CENTER ONLY)
ALT: 48 U/L — ABNORMAL HIGH (ref 0–44)
AST: 25 U/L (ref 15–41)
Albumin: 4 g/dL (ref 3.5–5.0)
Alkaline Phosphatase: 52 U/L (ref 38–126)
Anion gap: 9 (ref 5–15)
BUN: 12 mg/dL (ref 6–20)
CO2: 31 mmol/L (ref 22–32)
Calcium: 9.7 mg/dL (ref 8.9–10.3)
Chloride: 102 mmol/L (ref 98–111)
Creatinine: 1.01 mg/dL (ref 0.61–1.24)
GFR, Estimated: >60 mL/min (ref >60)
Glucose, Bld: 103 mg/dL — ABNORMAL HIGH (ref 70–99)
Potassium: 3.9 mmol/L (ref 3.5–5.1)
Sodium: 142 mmol/L (ref 135–145)
Total Bilirubin: 0.8 mg/dL (ref 0.3–1.2)
Total Protein: 7 g/dL (ref 6.5–8.1)

## 2020-12-22 LAB — CBC WITH DIFFERENTIAL (CANCER CENTER ONLY)
Abs Immature Granulocytes: 0.01 10*3/uL (ref 0.00–0.07)
Basophils Absolute: 0 10*3/uL (ref 0.0–0.1)
Basophils Relative: 0 %
Eosinophils Absolute: 0.1 10*3/uL (ref 0.0–0.5)
Eosinophils Relative: 1 %
HCT: 42.6 % (ref 39.0–52.0)
Hemoglobin: 14.1 g/dL (ref 13.0–17.0)
Immature Granulocytes: 0 %
Lymphocytes Relative: 44 %
Lymphs Abs: 2 10*3/uL (ref 0.7–4.0)
MCH: 30.1 pg (ref 26.0–34.0)
MCHC: 33.1 g/dL (ref 30.0–36.0)
MCV: 91 fL (ref 80.0–100.0)
Monocytes Absolute: 0.3 10*3/uL (ref 0.1–1.0)
Monocytes Relative: 7 %
Neutro Abs: 2.1 10*3/uL (ref 1.7–7.7)
Neutrophils Relative %: 48 %
Platelet Count: 177 10*3/uL (ref 150–400)
RBC: 4.68 MIL/uL (ref 4.22–5.81)
RDW: 13.1 % (ref 11.5–15.5)
WBC Count: 4.6 10*3/uL (ref 4.0–10.5)
nRBC: 0 % (ref 0.0–0.2)

## 2020-12-23 ENCOUNTER — Telehealth: Payer: Self-pay | Admitting: Hematology and Oncology

## 2020-12-23 NOTE — Telephone Encounter (Signed)
Scheduled per los. Called and left msg. Mailed printout  °

## 2020-12-26 MED FILL — Rivaroxaban Tab 10 MG: ORAL | 30 days supply | Qty: 30 | Fill #1 | Status: AC

## 2020-12-26 MED FILL — Amlodipine Besylate Tab 5 MG (Base Equivalent): ORAL | 30 days supply | Qty: 30 | Fill #1 | Status: AC

## 2020-12-27 ENCOUNTER — Ambulatory Visit: Payer: Self-pay | Admitting: Nurse Practitioner

## 2020-12-27 ENCOUNTER — Encounter: Payer: Self-pay | Admitting: Nurse Practitioner

## 2020-12-27 ENCOUNTER — Other Ambulatory Visit: Payer: Self-pay

## 2020-12-29 ENCOUNTER — Other Ambulatory Visit: Payer: Self-pay

## 2021-01-01 MED FILL — Atorvastatin Calcium Tab 20 MG (Base Equivalent): ORAL | 30 days supply | Qty: 30 | Fill #1 | Status: AC

## 2021-01-03 ENCOUNTER — Other Ambulatory Visit: Payer: Self-pay

## 2021-01-08 MED FILL — Allopurinol Tab 100 MG: ORAL | 30 days supply | Qty: 30 | Fill #1 | Status: AC

## 2021-01-08 MED FILL — Levothyroxine Sodium Tab 100 MCG: ORAL | 30 days supply | Qty: 30 | Fill #1 | Status: AC

## 2021-01-11 ENCOUNTER — Other Ambulatory Visit: Payer: Self-pay

## 2021-01-13 ENCOUNTER — Other Ambulatory Visit: Payer: Self-pay

## 2021-01-18 ENCOUNTER — Other Ambulatory Visit: Payer: Self-pay

## 2021-01-25 ENCOUNTER — Other Ambulatory Visit: Payer: Self-pay

## 2021-01-25 MED FILL — Rivaroxaban Tab 10 MG: ORAL | 30 days supply | Qty: 30 | Fill #2 | Status: AC

## 2021-01-25 MED FILL — Amlodipine Besylate Tab 5 MG (Base Equivalent): ORAL | 30 days supply | Qty: 30 | Fill #2 | Status: AC

## 2021-01-27 ENCOUNTER — Other Ambulatory Visit: Payer: Self-pay

## 2021-02-06 MED FILL — Atorvastatin Calcium Tab 20 MG (Base Equivalent): ORAL | 30 days supply | Qty: 30 | Fill #2 | Status: AC

## 2021-02-06 MED FILL — Allopurinol Tab 100 MG: ORAL | 30 days supply | Qty: 30 | Fill #2 | Status: AC

## 2021-02-06 MED FILL — Levothyroxine Sodium Tab 100 MCG: ORAL | 30 days supply | Qty: 30 | Fill #2 | Status: AC

## 2021-02-07 ENCOUNTER — Other Ambulatory Visit: Payer: Self-pay

## 2021-02-11 ENCOUNTER — Other Ambulatory Visit: Payer: Self-pay

## 2021-02-23 ENCOUNTER — Ambulatory Visit: Payer: Self-pay | Attending: Nurse Practitioner | Admitting: Nurse Practitioner

## 2021-02-23 ENCOUNTER — Other Ambulatory Visit: Payer: Self-pay

## 2021-02-23 ENCOUNTER — Encounter: Payer: Self-pay | Admitting: Nurse Practitioner

## 2021-02-23 VITALS — BP 107/64 | HR 58 | Ht 73.0 in | Wt 252.0 lb

## 2021-02-23 DIAGNOSIS — E1165 Type 2 diabetes mellitus with hyperglycemia: Secondary | ICD-10-CM

## 2021-02-23 DIAGNOSIS — E039 Hypothyroidism, unspecified: Secondary | ICD-10-CM

## 2021-02-23 DIAGNOSIS — Z86711 Personal history of pulmonary embolism: Secondary | ICD-10-CM

## 2021-02-23 MED ORDER — LEVOTHYROXINE SODIUM 100 MCG PO TABS
ORAL_TABLET | Freq: Every day | ORAL | 1 refills | Status: DC
  Filled 2021-02-23: qty 90, fill #0
  Filled 2021-03-11: qty 30, 30d supply, fill #0
  Filled 2021-04-14: qty 30, 30d supply, fill #1
  Filled 2021-05-08: qty 30, 30d supply, fill #2
  Filled 2021-06-07: qty 30, 30d supply, fill #3
  Filled 2021-07-11: qty 30, 30d supply, fill #4
  Filled 2021-08-11: qty 30, 30d supply, fill #5

## 2021-02-23 MED ORDER — RIVAROXABAN 10 MG PO TABS
ORAL_TABLET | Freq: Every day | ORAL | 3 refills | Status: DC
  Filled 2021-02-23: qty 30, 30d supply, fill #0
  Filled 2021-03-30: qty 30, 30d supply, fill #1
  Filled 2021-04-27: qty 30, 30d supply, fill #2
  Filled 2021-05-25: qty 30, 30d supply, fill #3

## 2021-02-23 NOTE — Progress Notes (Signed)
Assessment & Plan:  Glenn Conrad was seen today for diabetes.  Diagnoses and all orders for this visit:  Type 2 diabetes mellitus with hyperglycemia, without long-term current use of insulin (HCC) -     Hemoglobin A1c -     CMP14+EGFR Continue blood sugar control as discussed in office today, low carbohydrate diet, and regular physical exercise as tolerated, 150 minutes per week (30 min each day, 5 days per week, or 50 min 3 days per week). Keep blood sugar logs with fasting goal of 90-130 mg/dl, post prandial (after you eat) less than 180.  For Hypoglycemia: BS <60 and Hyperglycemia BS >400; contact the clinic ASAP. Annual eye exams and foot exams are recommended.   Acquired hypothyroidism -     levothyroxine (SYNTHROID) 100 MCG tablet; TAKE 1 TABLET (100 MCG TOTAL) BY MOUTH DAILY.  History of pulmonary embolism -     rivaroxaban (XARELTO) 10 MG TABS tablet; TAKE 1 TABLET (10 MG TOTAL) BY MOUTH DAILY.   Patient has been counseled on age-appropriate routine health concerns for screening and prevention. These are reviewed and up-to-date. Referrals have been placed accordingly. Immunizations are up-to-date or declined.    Subjective:   Chief Complaint  Patient presents with   Diabetes   HPI Glenn Conrad 40 y.o. male presents to office today for follow up.  has a past medical history of Diabetes mellitus without complication ([place]), Hyperlipidemia, Hypertension, PE (pulmonary thromboembolism) ([place]), Right leg DVT ([place]), and Thyroid disease.     Essential Hypertension Blood pressure is well controlled. He would like to discontinue amlodipine 5 mg today as he has been working on losing weight and healthier lifestyle. Denies chest pain, shortness of breath, palpitations, lightheadedness, dizziness, headaches or BLE edema.   BP Readings from Last 3 Encounters:  02/23/21 107/64  12/22/20 131/67  11/04/20 120/66     DIABETES Continues in prediabetes range. Will discontinue jardiance 10  mg today. He would like to manage his diabetes without medications at this time. LDL at goal with atorvastatin 20 mg daily.  Lab Results  Component Value Date   HGBA1C 5.9 (H) 02/23/2021    Lab Results  Component Value Date   LDLCALC 45 11/01/2020     Hypothyroidism Taking levothyroxine 100 mg daily. Being followed by endocrinology. Needs to call to schedule follow up appt.  Lab Results  Component Value Date   TSH 5.680 (H) 11/01/2020    Review of Systems  Constitutional:  Negative for fever, malaise/fatigue and weight loss.  HENT: Negative.  Negative for nosebleeds.   Eyes: Negative.  Negative for blurred vision, double vision and photophobia.  Respiratory: Negative.  Negative for cough and shortness of breath.   Cardiovascular: Negative.  Negative for chest pain, palpitations and leg swelling.  Gastrointestinal: Negative.  Negative for heartburn, nausea and vomiting.  Musculoskeletal: Negative.  Negative for myalgias.  Neurological: Negative.  Negative for dizziness, focal weakness, seizures and headaches.  Psychiatric/Behavioral: Negative.  Negative for suicidal ideas.    Past Medical History:  Diagnosis Date   Diabetes mellitus without complication ([place])    Hyperlipidemia    Hypertension    PE (pulmonary thromboembolism) ([place])    Right leg DVT (HCC)    Thyroid disease     Past Surgical History:  Procedure Laterality Date   WISDOM TOOTH EXTRACTION      Family History  Problem Relation Age of Onset   Diabetes Mother    Hypertension Mother    Hypertension Father  Diabetes Father     Social History Reviewed with no changes to be made today.   Outpatient Medications Prior to Visit  Medication Sig Dispense Refill   allopurinol (ZYLOPRIM) 100 MG tablet TAKE 1 TABLET (100 MG TOTAL) BY MOUTH DAILY. 90 tablet 1   atorvastatin (LIPITOR) 20 MG tablet TAKE 1 TABLET (20 MG TOTAL) BY MOUTH DAILY. 90 tablet 3   baclofen (LIORESAL) 10 MG tablet TAKE 1 TABLET (10 MG TOTAL)  BY MOUTH 3 (THREE) TIMES DAILY AS NEEDED FOR MUSCLE SPASMS. 15 tablet 0   Blood Glucose Monitoring Suppl (TRUE METRIX METER) w/Device KIT Use as instructed. Check blood glucose level by fingerstick twice per day.  E11.65 1 kit 0   colchicine 0.6 MG tablet Day 1: Take 0.6 mg 3 times daily on the first day of flare. Initiate as soon as possible, ideally within 12 to 24 hours of flare onset. Day 2 and thereafter:Take 0.6 mg once or twice daily until flare resolves then continue for 2 to 3 days after flare resolves 30 tablet 0   glucose blood test strip USE AS INSTRUCTED. CHECK BLOOD GLUCOSE LEVEL BY FINGERSTICK TWICE PER DAY. (Patient taking differently: USE AS INSTRUCTED. CHECK BLOOD GLUCOSE LEVEL BY FINGERSTICK TWICE PER DAY.) 100 strip 12   lidocaine (LIDODERM) 5 % PLACE 1 PATCH ONTO THE SKIN DAILY. 30 patch 0   TRUEplus Lancets 28G MISC USE AS INSTRUCTED. CHECK BLOOD GLUCOSE LEVEL BY FINGERSTICK TWICE PER DAY. 100 each 3   amLODipine (NORVASC) 5 MG tablet TAKE 1 TABLET (5 MG TOTAL) BY MOUTH DAILY. TO LOWER BLOOD PRESSURE 90 tablet 1   empagliflozin (JARDIANCE) 10 MG TABS tablet TAKE 1 TABLET (10 MG TOTAL) BY MOUTH DAILY BEFORE BREAKFAST. 90 tablet 1   fluticasone (FLONASE) 50 MCG/ACT nasal spray Place 1 spray into both nostrils daily. 16 g 6   ibuprofen (ADVIL) 800 MG tablet TAKE 1 TABLET (800 MG TOTAL) BY MOUTH 3 (THREE) TIMES DAILY FOR 7 DAYS. 21 tablet 0   levothyroxine (SYNTHROID) 100 MCG tablet TAKE 1 TABLET (100 MCG TOTAL) BY MOUTH DAILY. 90 tablet 1   rivaroxaban (XARELTO) 10 MG TABS tablet TAKE 1 TABLET (10 MG TOTAL) BY MOUTH DAILY. 90 tablet 3   No facility-administered medications prior to visit.    Allergies  Allergen Reactions   Glipizide Diarrhea   Lisinopril     Headaches    Metformin And Related Diarrhea       Objective:    BP 107/64   Pulse (!) 58   Ht 6' 1"  (1.854 m)   Wt 252 lb (114.3 kg)   SpO2 99%   BMI 33.25 kg/m  Wt Readings from Last 3 Encounters:  02/23/21  252 lb (114.3 kg)  12/22/20 258 lb 6.4 oz (117.2 kg)  11/04/20 258 lb 9.6 oz (117.3 kg)    Physical Exam Vitals and nursing note reviewed.  Constitutional:      Appearance: He is well-developed.  HENT:     Head: Normocephalic and atraumatic.  Cardiovascular:     Rate and Rhythm: Normal rate and regular rhythm.     Heart sounds: Normal heart sounds. No murmur heard.   No friction rub. No gallop.  Pulmonary:     Effort: Pulmonary effort is normal. No tachypnea or respiratory distress.     Breath sounds: Normal breath sounds. No decreased breath sounds, wheezing, rhonchi or rales.  Chest:     Chest wall: No tenderness.  Abdominal:     General:  Bowel sounds are normal.     Palpations: Abdomen is soft.  Musculoskeletal:        General: Normal range of motion.     Cervical back: Normal range of motion.  Skin:    General: Skin is warm and dry.  Neurological:     Mental Status: He is alert and oriented to person, place, and time.     Coordination: Coordination normal.  Psychiatric:        Behavior: Behavior normal. Behavior is cooperative.        Thought Content: Thought content normal.        Judgment: Judgment normal.         Patient has been counseled extensively about nutrition and exercise as well as the importance of adherence with medications and regular follow-up. The patient was given clear instructions to go to ER or return to medical center if symptoms don't improve, worsen or new problems develop. The patient verbalized understanding.   Follow-up: Return in about 5 weeks (around 03/30/2021) for BP CHECK WITH LUKE see me in 3 months in office.   [name], FNP-BC [facility] and [facility], [place]   02/24/2021, 8:32 PM

## 2021-02-24 ENCOUNTER — Encounter: Payer: Self-pay | Admitting: Nurse Practitioner

## 2021-02-24 LAB — CMP14+EGFR
ALT: 39 IU/L (ref 0–44)
AST: 21 IU/L (ref 0–40)
Albumin/Globulin Ratio: 1.8 (ref 1.2–2.2)
Albumin: 4.7 g/dL (ref 4.0–5.0)
Alkaline Phosphatase: 58 IU/L (ref 44–121)
BUN/Creatinine Ratio: 12 (ref 9–20)
BUN: 12 mg/dL (ref 6–24)
Bilirubin Total: 0.6 mg/dL (ref 0.0–1.2)
CO2: 26 mmol/L (ref 20–29)
Calcium: 9.7 mg/dL (ref 8.7–10.2)
Chloride: 103 mmol/L (ref 96–106)
Creatinine, Ser: 1 mg/dL (ref 0.76–1.27)
Globulin, Total: 2.6 g/dL (ref 1.5–4.5)
Glucose: 80 mg/dL (ref 65–99)
Potassium: 4.3 mmol/L (ref 3.5–5.2)
Sodium: 144 mmol/L (ref 134–144)
Total Protein: 7.3 g/dL (ref 6.0–8.5)
eGFR: 97 mL/min/{1.73_m2} (ref >59)

## 2021-02-24 LAB — HEMOGLOBIN A1C
Est. average glucose Bld gHb Est-mCnc: 123 mg/dL
Hgb A1c MFr Bld: 5.9 % — ABNORMAL HIGH (ref 4.8–5.6)

## 2021-02-26 ENCOUNTER — Encounter: Payer: Self-pay | Admitting: Nurse Practitioner

## 2021-02-27 ENCOUNTER — Other Ambulatory Visit: Payer: Self-pay | Admitting: Nurse Practitioner

## 2021-02-27 MED ORDER — AMLODIPINE BESYLATE 5 MG PO TABS
5.0000 mg | ORAL_TABLET | Freq: Every day | ORAL | 0 refills | Status: DC
  Filled 2021-02-27: qty 30, 30d supply, fill #0

## 2021-02-28 ENCOUNTER — Encounter: Payer: Self-pay | Admitting: Nurse Practitioner

## 2021-02-28 ENCOUNTER — Other Ambulatory Visit: Payer: Self-pay

## 2021-03-01 ENCOUNTER — Other Ambulatory Visit: Payer: Self-pay

## 2021-03-11 ENCOUNTER — Other Ambulatory Visit: Payer: Self-pay

## 2021-03-11 ENCOUNTER — Encounter: Payer: Self-pay | Admitting: Nurse Practitioner

## 2021-03-11 MED FILL — Allopurinol Tab 100 MG: ORAL | 30 days supply | Qty: 30 | Fill #3 | Status: AC

## 2021-03-11 MED FILL — Atorvastatin Calcium Tab 20 MG (Base Equivalent): ORAL | 30 days supply | Qty: 30 | Fill #3 | Status: AC

## 2021-03-13 ENCOUNTER — Other Ambulatory Visit: Payer: Self-pay | Admitting: Nurse Practitioner

## 2021-03-13 MED ORDER — EMPAGLIFLOZIN 10 MG PO TABS
10.0000 mg | ORAL_TABLET | Freq: Every day | ORAL | 0 refills | Status: DC
  Filled 2021-03-13: qty 30, 30d supply, fill #0
  Filled 2021-04-15: qty 30, 30d supply, fill #1
  Filled 2021-05-25: qty 30, 30d supply, fill #2

## 2021-03-14 ENCOUNTER — Other Ambulatory Visit: Payer: Self-pay

## 2021-03-30 ENCOUNTER — Other Ambulatory Visit: Payer: Self-pay

## 2021-03-30 ENCOUNTER — Ambulatory Visit: Payer: Self-pay | Attending: Nurse Practitioner | Admitting: Pharmacist

## 2021-03-30 VITALS — BP 116/74

## 2021-03-30 DIAGNOSIS — I10 Essential (primary) hypertension: Secondary | ICD-10-CM

## 2021-03-30 MED ORDER — AMLODIPINE BESYLATE 5 MG PO TABS
5.0000 mg | ORAL_TABLET | Freq: Every day | ORAL | 1 refills | Status: DC
  Filled 2021-03-30: qty 30, 30d supply, fill #0
  Filled 2021-04-27: qty 30, 30d supply, fill #1

## 2021-03-30 NOTE — Progress Notes (Signed)
   S:    PCP: Zelda   Patient arrives in good spirits. Presents to the clinic for hypertension evaluation, counseling, and management. Patient was referred and last seen by Primary Care Provider on 02/23/2021. BP was 107/64 mmHg at that visit. He requested to come off of amlodipine at that visit and attempt control with lifestyle only. Since then, he has requested refills of amlodipine.   Medication adherence: pt tells me today that he will take amlodipine once every 2-3 days when he checks it and sees that it's >130/90 mmHg.    Current BP Medications include:  amlodipine 5 mg daily   Dietary habits include: does not add salt to foods. Will eat out occasionally but he tells me he orders bland food. Additionally, he will drink coffee or tea but avoid sodas.  Exercise habits include: very active at work.  Family / Social history:  -Fhx: DM, HTN -Tobacco: current 0.5 PPD smoker  -Alcohol: none reported   O:  Vitals:   03/30/21 1527  BP: 116/74   Home BP readings: none  Last 3 Office BP readings: BP Readings from Last 3 Encounters:  03/30/21 116/74  02/23/21 107/64  12/22/20 131/67    BMET    Component Value Date/Time   NA 144 02/23/2021 1543   K 4.3 02/23/2021 1543   CL 103 02/23/2021 1543   CO2 26 02/23/2021 1543   GLUCOSE 80 02/23/2021 1543   GLUCOSE 103 (H) 12/22/2020 0756   BUN 12 02/23/2021 1543   CREATININE 1.00 02/23/2021 1543   CREATININE 1.01 12/22/2020 0756   CALCIUM 9.7 02/23/2021 1543   GFRNONAA >60 12/22/2020 0756   GFRAA >60 04/27/2020 2048   GFRAA >60 01/02/2020 1019    Renal function: CrCl cannot be calculated (Patient's most recent lab result is older than the maximum 21 days allowed.).  Clinical ASCVD: No  The ASCVD Risk score Denman George DC Jr., et al., 2013) failed to calculate for the following reasons:   The valid total cholesterol range is 130 to 320 mg/dL   A/P: Hypertension longstanding currently at goal on current medications. BP Goal = <  130/80 mmHg. Medication adherence: he is not taking amlodipine every day. I encouraged compliance.  -Continued amlodipine 5 mg daily.  -Counseled on lifestyle modifications for blood pressure control including reduced dietary sodium, increased exercise, adequate sleep.  Results reviewed and written information provided.   Total time in face-to-face counseling 15 minutes.   F/U Clinic Visit in 1 month.   Butch Penny, PharmD, P[place]y [place], CPP Clinical Pharmacist [facility] [phone]

## 2021-03-31 ENCOUNTER — Other Ambulatory Visit: Payer: Self-pay

## 2021-04-14 ENCOUNTER — Other Ambulatory Visit: Payer: Self-pay

## 2021-04-14 MED FILL — Allopurinol Tab 100 MG: ORAL | 30 days supply | Qty: 30 | Fill #4 | Status: AC

## 2021-04-15 ENCOUNTER — Other Ambulatory Visit: Payer: Self-pay

## 2021-04-17 ENCOUNTER — Other Ambulatory Visit: Payer: Self-pay | Admitting: Nurse Practitioner

## 2021-04-17 DIAGNOSIS — Z6841 Body Mass Index (BMI) 40.0 and over, adult: Secondary | ICD-10-CM

## 2021-04-17 DIAGNOSIS — E785 Hyperlipidemia, unspecified: Secondary | ICD-10-CM

## 2021-04-18 MED ORDER — ATORVASTATIN CALCIUM 20 MG PO TABS
ORAL_TABLET | Freq: Every day | ORAL | 3 refills | Status: DC
  Filled 2021-04-18: qty 30, 30d supply, fill #0

## 2021-04-19 ENCOUNTER — Other Ambulatory Visit: Payer: Self-pay

## 2021-04-21 ENCOUNTER — Other Ambulatory Visit: Payer: Self-pay

## 2021-04-21 IMAGING — CT CT ANGIO CHEST
2 of 6 series · 18 of 36 positions shown · IV contrast (omnipaque)
Comparison: 12/07/1999

CLINICAL DATA: Right-sided chest pain.

EXAM:
CT ANGIOGRAPHY CHEST WITH CONTRAST
TECHNIQUE: Multidetector CT imaging of the chest was performed using the
standard protocol during bolus administration of intravenous
contrast. Multiplanar CT image reconstructions and MIPs were
obtained to evaluate the vascular anatomy.
CONTRAST:  75mL OMNIPAQUE IOHEXOL 350 MG/ML SOLN

[Series 5: thins · axial · 0.71mm/px · z∈[+1356,+1608]mm · 17 of 284 slices shown]
[im 16/284  lung]
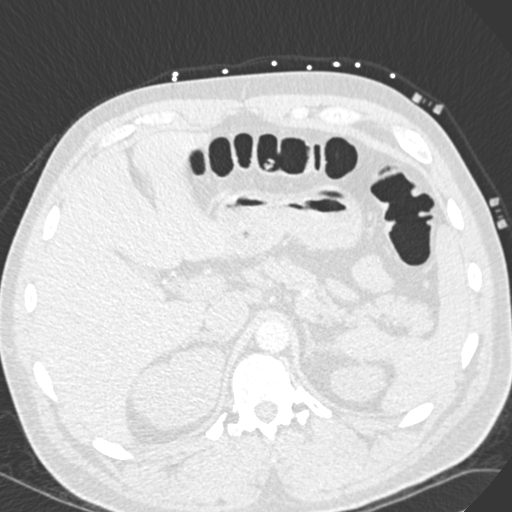
[im 32/284  mediastinal]
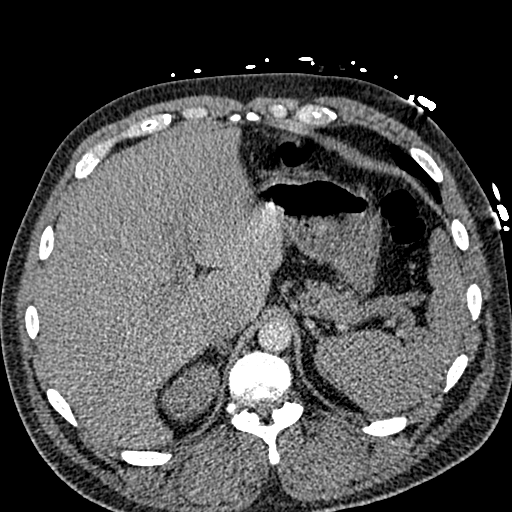
[im 48/284  lung]
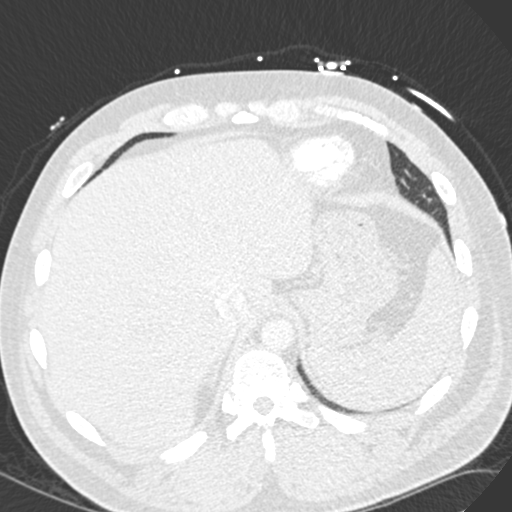
[im 63/284  mediastinal]
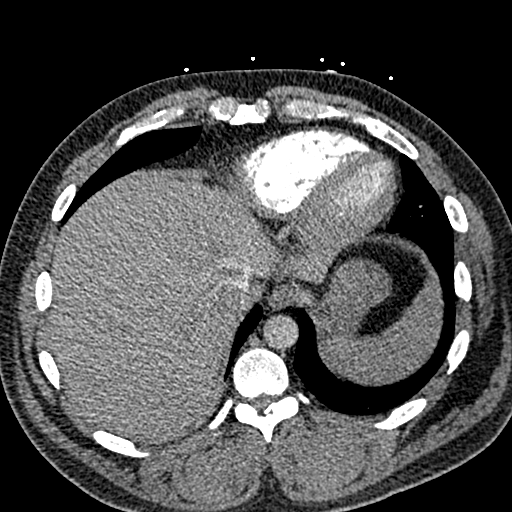
[im 79/284  lung]
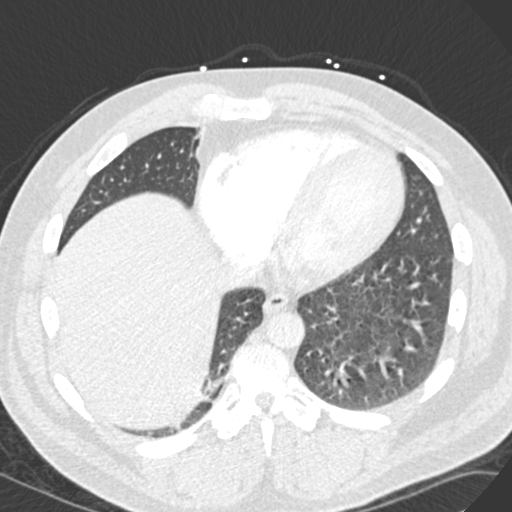
[im 95/284  mediastinal]
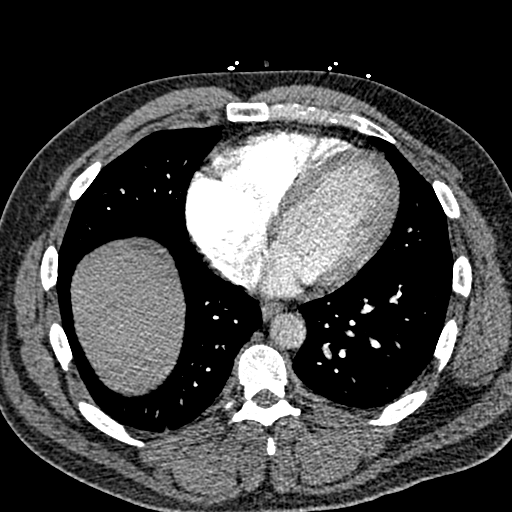
[im 111/284  lung]
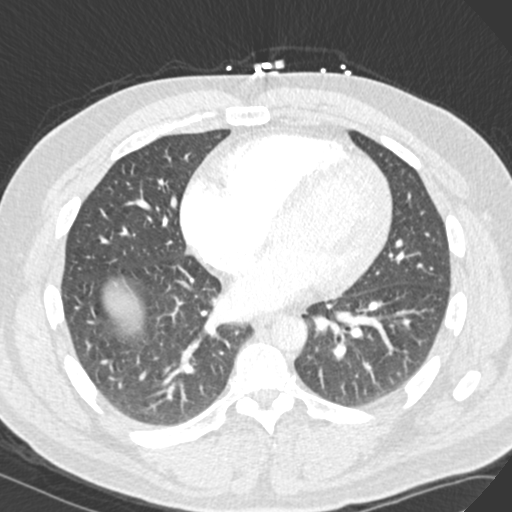
[im 126/284  mediastinal]
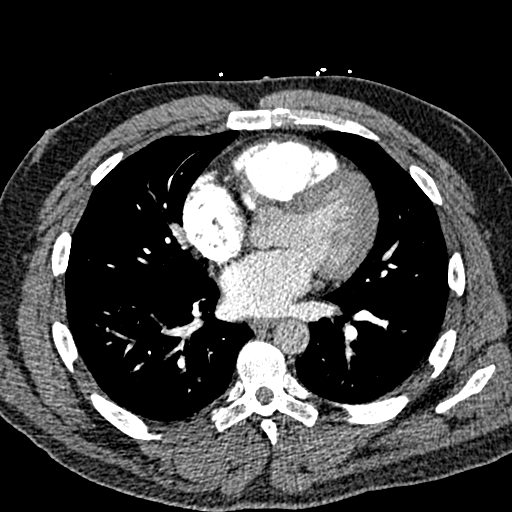
[im 142/284  lung]
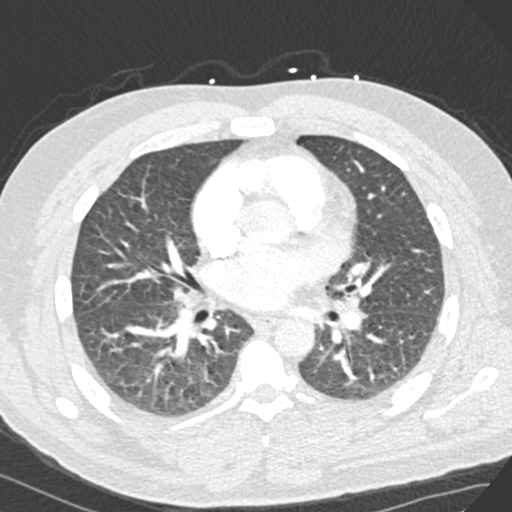
[im 158/284  mediastinal]
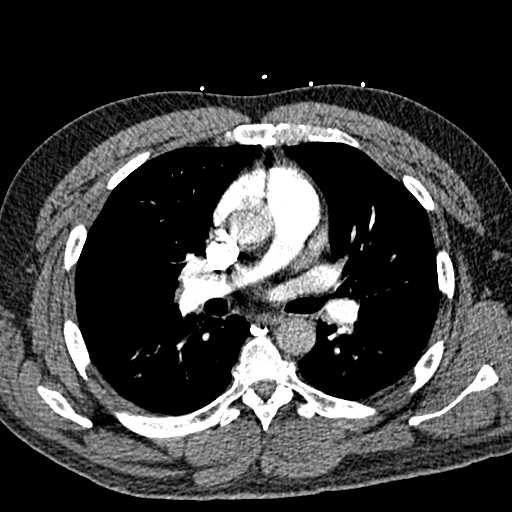
[im 173/284  lung]
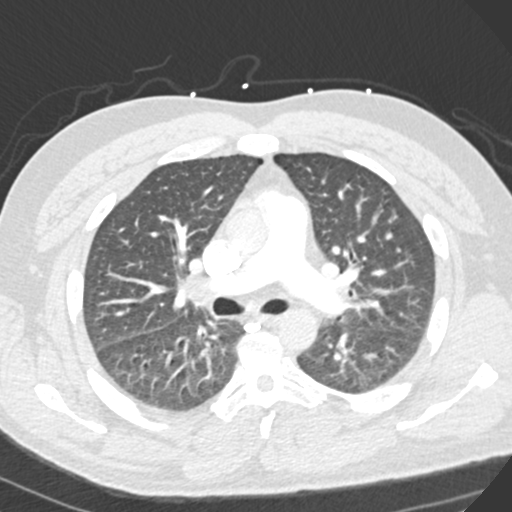
[im 189/284  mediastinal]
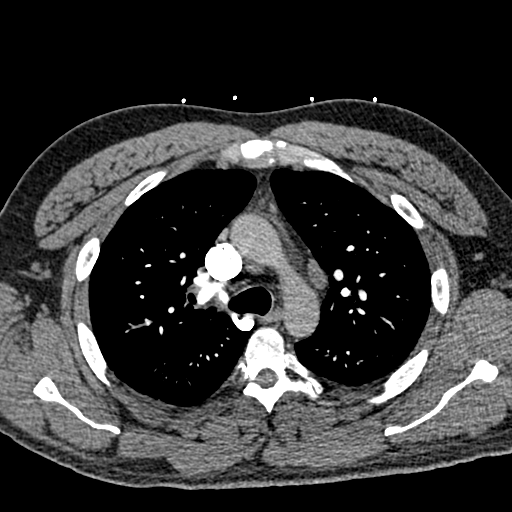
[im 205/284  lung]
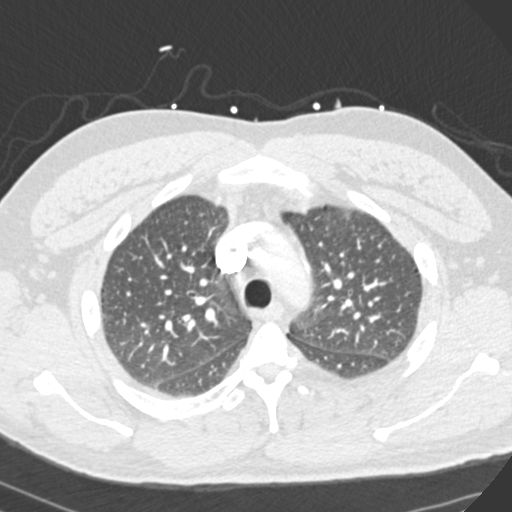
[im 221/284  mediastinal]
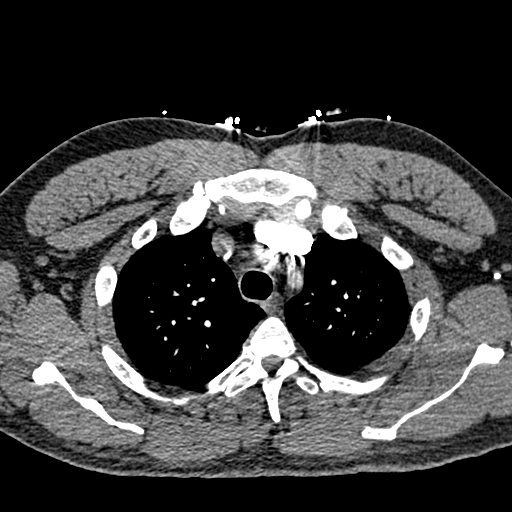
[im 236/284  lung]
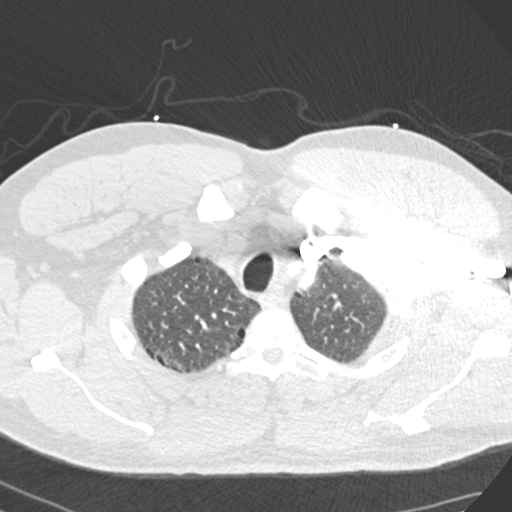
[im 252/284  mediastinal]
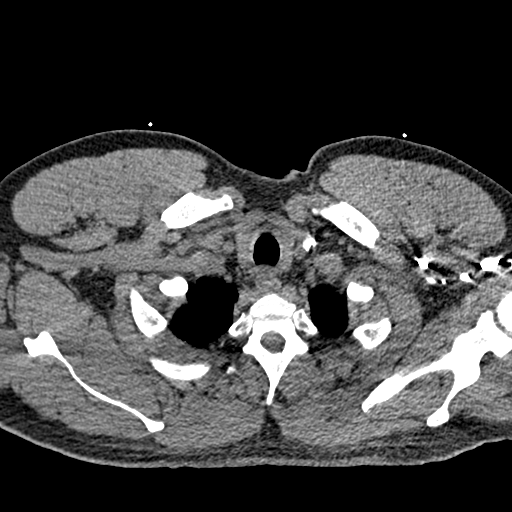
[im 268/284  lung]
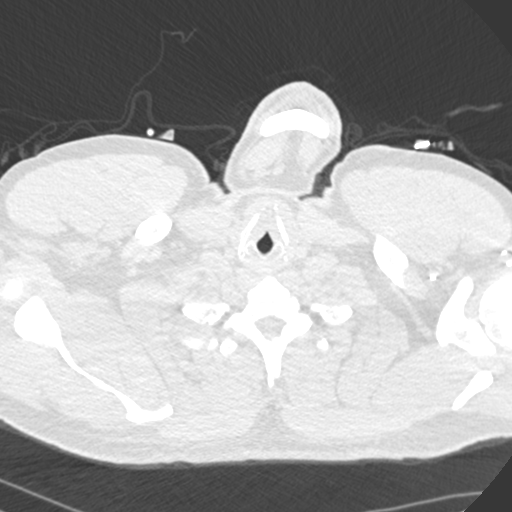

[Series 7: coronal mpr · coronal · 0.58mm/px · 1 of 151 slices shown]
[im 76/151  mediastinal]
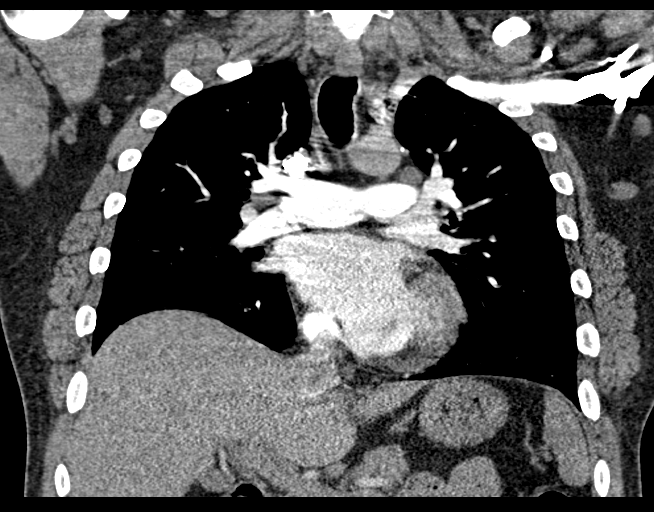

[18 of 36 positions shown; findings below may reference images not displayed]

FINDINGS: Cardiovascular: The heart size is normal. No substantial pericardial
effusion. No thoracic aortic aneurysm. No large central pulmonary
embolus. No evidence for pulmonary embolic disease to the upper
lobes. No lobar or segmental pulmonary embolus to the lower lobes.
Distal segmental and subsegmental pulmonary arteries in the lower
lobes show no definite filling defects although assessment is
degraded secondary to breathing motion.

Mediastinum/Nodes: Stable 10 mm short axis AP window lymph node. 14
mm short axis right hilar lymph node is unchanged. No other
mediastinal or hilar lymphadenopathy. The esophagus has normal
imaging features. Insert normal axillary

Lungs/Pleura: Centrilobular and paraseptal emphysema evident. No
suspicious pulmonary nodule or mass. No focal airspace
consolidation. There is no evidence of pleural effusion.

Upper Abdomen: Unremarkable.

Musculoskeletal: No worrisome lytic or sclerotic osseous
abnormality.

Review of the MIP images confirms the above findings.
IMPRESSION: 1. No CT evidence for acute pulmonary embolus. Distal segmental and
subsegmental pulmonary arteries in the lower lobes show no definite
filling defects although assessment may not be reliable due to
breathing motion.
2. Stable mild lymphadenopathy in the right hilum and AP window,
likely reactive.
3. Emphysema ([id number]).

## 2021-04-27 ENCOUNTER — Other Ambulatory Visit: Payer: Self-pay

## 2021-04-28 ENCOUNTER — Other Ambulatory Visit: Payer: Self-pay

## 2021-05-02 ENCOUNTER — Other Ambulatory Visit: Payer: Self-pay

## 2021-05-04 ENCOUNTER — Ambulatory Visit: Payer: Self-pay | Admitting: Pharmacist

## 2021-05-04 NOTE — Progress Notes (Unsigned)
   S:    PCP: Glenn Conrad   Patient arrives in good spirits. Presents to the clinic for hypertension evaluation, counseling, and management. Patient was referred and last seen by Primary Care Provider on 02/23/2021. Patient was last seen by pharmacy clinic on 03/30/2021. At that visit, patient reported taking amlodipine once every 2-3 days when he checks it and sees that it's >130/90 mmHg. Clinic BP at pharmacy visit 116/74.   At today's visit, patient reports taking amlodipine daily *** still taking amlodipine 2-3 days/week.   Current BP Medications include:  amlodipine 5 mg daily   Dietary habits include: does not add salt to foods. Will eat out occasionally but he tells me he orders bland food. Additionally, he will drink coffee or tea but avoid sodas.  Exercise habits include: very active at work.  Family / Social history:  -Fhx: DM, HTN -Tobacco: current 0.5 PPD smoker  -Alcohol: none reported   O:  There were no vitals filed for this visit.  Home BP readings: none  Last 3 Office BP readings: BP Readings from Last 3 Encounters:  03/30/21 116/74  02/23/21 107/64  12/22/20 131/67   BMET    Component Value Date/Time   NA 144 02/23/2021 1543   K 4.3 02/23/2021 1543   CL 103 02/23/2021 1543   CO2 26 02/23/2021 1543   GLUCOSE 80 02/23/2021 1543   GLUCOSE 103 (H) 12/22/2020 0756   BUN 12 02/23/2021 1543   CREATININE 1.00 02/23/2021 1543   CREATININE 1.01 12/22/2020 0756   CALCIUM 9.7 02/23/2021 1543   GFRNONAA >60 12/22/2020 0756   GFRAA >60 04/27/2020 2048   GFRAA >60 01/02/2020 1019   Renal function: CrCl cannot be calculated (Patient's most recent lab result is older than the maximum 21 days allowed.).  Clinical ASCVD: No  The ASCVD Risk score (Arnett DK, et al., 2019) failed to calculate for the following reasons:   The valid total cholesterol range is 130 to 320 mg/dL  A/P: Hypertension longstanding currently at goal on current medications. BP Goal = < 130/80 mmHg.  Medication adherence: he is not taking amlodipine every day. I encouraged compliance.  -Continued amlodipine 5 mg daily.  -Counseled on lifestyle modifications for blood pressure control including reduced dietary sodium, increased exercise, adequate sleep.  Results reviewed and written information provided.   Total time in face-to-face counseling 15 minutes.   F/U Clinic Visit in 1 month.   [name], Pharm.D. PGY-1 Pharmacy Resident 05/04/2021 2:55 PM

## 2021-05-08 ENCOUNTER — Other Ambulatory Visit: Payer: Self-pay | Admitting: Nurse Practitioner

## 2021-05-08 DIAGNOSIS — M1A9XX0 Chronic gout, unspecified, without tophus (tophi): Secondary | ICD-10-CM

## 2021-05-09 ENCOUNTER — Other Ambulatory Visit: Payer: Self-pay

## 2021-05-09 MED ORDER — ALLOPURINOL 100 MG PO TABS
ORAL_TABLET | Freq: Every day | ORAL | 0 refills | Status: DC
  Filled 2021-05-09: qty 30, 30d supply, fill #0

## 2021-05-09 NOTE — Telephone Encounter (Signed)
Labs due. Pt has appt 08/27/20

## 2021-05-10 ENCOUNTER — Telehealth: Payer: Self-pay | Admitting: Nurse Practitioner

## 2021-05-10 NOTE — Telephone Encounter (Signed)
Copied from CRM [id number]. Topic: Appointment Scheduling - Scheduling Inquiry for Clinic >> May 04, 2021 10:57 AM Randol Kern wrote: Reason for CRM: Pt called to reschedule his appt with [facility] today. Please advise, tried calling the office Best contact: [phone]   Called patient and left vm to call [phone] to reschedule appointment with [facility].

## 2021-05-13 ENCOUNTER — Other Ambulatory Visit: Payer: Self-pay

## 2021-05-26 ENCOUNTER — Other Ambulatory Visit: Payer: Self-pay

## 2021-05-27 ENCOUNTER — Other Ambulatory Visit: Payer: Self-pay

## 2021-05-27 ENCOUNTER — Ambulatory Visit: Payer: Self-pay | Attending: Nurse Practitioner | Admitting: Nurse Practitioner

## 2021-05-27 ENCOUNTER — Encounter: Payer: Self-pay | Admitting: Nurse Practitioner

## 2021-05-27 VITALS — BP 129/78 | HR 60 | Ht 73.0 in | Wt 248.0 lb

## 2021-05-27 DIAGNOSIS — Z86711 Personal history of pulmonary embolism: Secondary | ICD-10-CM

## 2021-05-27 DIAGNOSIS — M545 Low back pain, unspecified: Secondary | ICD-10-CM

## 2021-05-27 DIAGNOSIS — E1165 Type 2 diabetes mellitus with hyperglycemia: Secondary | ICD-10-CM

## 2021-05-27 DIAGNOSIS — M1A9XX0 Chronic gout, unspecified, without tophus (tophi): Secondary | ICD-10-CM

## 2021-05-27 DIAGNOSIS — G8929 Other chronic pain: Secondary | ICD-10-CM

## 2021-05-27 DIAGNOSIS — E785 Hyperlipidemia, unspecified: Secondary | ICD-10-CM

## 2021-05-27 DIAGNOSIS — I10 Essential (primary) hypertension: Secondary | ICD-10-CM

## 2021-05-27 MED ORDER — ALLOPURINOL 100 MG PO TABS
100.0000 mg | ORAL_TABLET | Freq: Every day | ORAL | 1 refills | Status: DC
  Filled 2021-05-27: qty 90, 90d supply, fill #0
  Filled 2021-06-07: qty 30, 30d supply, fill #0
  Filled 2021-07-11: qty 30, 30d supply, fill #1
  Filled 2021-08-11: qty 30, 30d supply, fill #2
  Filled 2021-09-07: qty 30, 30d supply, fill #3
  Filled 2021-09-08: qty 30, 30d supply, fill #0
  Filled 2021-10-09: qty 30, 30d supply, fill #1

## 2021-05-27 MED ORDER — EMPAGLIFLOZIN 10 MG PO TABS
10.0000 mg | ORAL_TABLET | Freq: Every day | ORAL | 1 refills | Status: DC
Start: 2021-05-27 — End: 2021-10-14
  Filled 2021-05-27: qty 90, 90d supply, fill #0
  Filled 2021-05-27: qty 30, 30d supply, fill #0
  Filled 2021-06-26: qty 30, 30d supply, fill #1

## 2021-05-27 MED ORDER — COLCHICINE 0.6 MG PO TABS
ORAL_TABLET | ORAL | 0 refills | Status: AC
  Filled 2021-05-27: qty 30, 10d supply, fill #0

## 2021-05-27 MED ORDER — LIDOCAINE 5 % EX PTCH
MEDICATED_PATCH | CUTANEOUS | 0 refills | Status: AC
Start: 2021-05-27 — End: 2022-05-27
  Filled 2021-05-27: qty 30, 30d supply, fill #0

## 2021-05-27 MED ORDER — TRUEPLUS LANCETS 28G MISC
3 refills | Status: DC
  Filled 2021-05-27: qty 100, 50d supply, fill #0

## 2021-05-27 MED ORDER — AMLODIPINE BESYLATE 5 MG PO TABS
5.0000 mg | ORAL_TABLET | Freq: Every day | ORAL | 1 refills | Status: DC
Start: 2021-05-27 — End: 2021-09-23
  Filled 2021-05-27 – 2021-06-07 (×2): qty 30, 30d supply, fill #0
  Filled 2021-08-11: qty 30, 30d supply, fill #1

## 2021-05-27 MED ORDER — GLUCOSE BLOOD VI STRP
ORAL_STRIP | 12 refills | Status: DC
  Filled 2021-05-27: qty 100, 50d supply, fill #0

## 2021-05-27 MED ORDER — RIVAROXABAN 10 MG PO TABS
ORAL_TABLET | Freq: Every day | ORAL | 3 refills | Status: DC
  Filled 2021-06-26 – 2021-09-22 (×2): qty 90, 90d supply, fill #0

## 2021-05-27 MED ORDER — ATORVASTATIN CALCIUM 20 MG PO TABS
ORAL_TABLET | Freq: Every day | ORAL | 3 refills | Status: DC
  Filled 2021-05-27 – 2021-06-07 (×2): qty 30, 30d supply, fill #0
  Filled 2021-10-09: qty 30, 30d supply, fill #1
  Filled 2021-10-10: qty 30, 30d supply, fill #0

## 2021-05-27 MED ORDER — BACLOFEN 10 MG PO TABS
ORAL_TABLET | Freq: Three times a day (TID) | ORAL | 0 refills | Status: AC | PRN
  Filled 2021-05-27: qty 15, 5d supply, fill #0

## 2021-05-27 NOTE — Progress Notes (Signed)
Assessment & Plan:  Glenn Conrad was seen today for hypertension.  Diagnoses and all orders for this visit:  Primary hypertension -     amLODipine (NORVASC) 5 MG tablet; Take 1 tablet (5 mg total) by mouth daily. Continue all antihypertensives as prescribed.  Remember to bring in your blood pressure log with you for your follow up appointment.  DASH/Mediterranean Diets are healthier choices for HTN.    Type 2 diabetes mellitus with hyperglycemia, without long-term current use of insulin (HCC) -     empagliflozin (JARDIANCE) 10 MG TABS tablet; Take 1 tablet (10 mg total) by mouth daily before breakfast. -     glucose blood test strip; USE AS INSTRUCTED. CHECK BLOOD GLUCOSE LEVEL BY FINGERSTICK TWICE PER DAY. -     TRUEplus Lancets 28G MISC; USE AS INSTRUCTED. CHECK BLOOD GLUCOSE LEVEL BY FINGERSTICK TWICE PER DAY. -     Microalbumin / creatinine urine ratio Continue blood sugar control as discussed in office today, low carbohydrate diet, and regular physical exercise as tolerated, 150 minutes per week (30 min each day, 5 days per week, or 50 min 3 days per week). Keep blood sugar logs with fasting goal of 90-130 mg/dl, post prandial (after you eat) less than 180.  For Hypoglycemia: BS <60 and Hyperglycemia BS >400; contact the clinic ASAP. Annual eye exams and foot exams are recommended.    Chronic gout without tophus, unspecified cause, unspecified site -     allopurinol (ZYLOPRIM) 100 MG tablet; Take 1 tablet (100 mg total) by mouth daily. -     colchicine 0.6 MG tablet; Day 1: Take 0.6 mg 3 times daily on the first day of flare. Initiate as soon as possible, ideally within 12 to 24 hours of flare onset. Day 2 and thereafter:Take 0.6 mg once or twice daily until flare resolves then continue for 2 to 3 days after flare resolves  Dyslipidemia, goal LDL below 70 -     atorvastatin (LIPITOR) 20 MG tablet; TAKE 1 TABLET (20 MG TOTAL) BY MOUTH DAILY. INSTRUCTIONS: Work on a low fat, heart healthy  diet and participate in regular aerobic exercise program by working out at least 150 minutes per week; 5 days a week-30 minutes per day. Avoid red meat/beef/steak,  fried foods. junk foods, sodas, sugary drinks, unhealthy snacking, alcohol and smoking.  Drink at least 80 oz of water per day and monitor your carbohydrate intake daily.    History of pulmonary embolism -     rivaroxaban (XARELTO) 10 MG TABS tablet; TAKE 1 TABLET (10 MG TOTAL) BY MOUTH DAILY.  Chronic bilateral low back pain without sciatica -     baclofen (LIORESAL) 10 MG tablet; TAKE 1 TABLET (10 MG TOTAL) BY MOUTH 3 (THREE) TIMES DAILY AS NEEDED FOR MUSCLE SPASMS. -     lidocaine (LIDODERM) 5 %; PLACE 1 PATCH ONTO THE SKIN DAILY.   Patient has been counseled on age-appropriate routine health concerns for screening and prevention. These are reviewed and up-to-date. Referrals have been placed accordingly. Immunizations are up-to-date or declined.    Subjective:   Chief Complaint  Patient presents with   Hypertension   Hypertension Pertinent negatives include no blurred vision, chest pain, headaches, malaise/fatigue, palpitations or shortness of breath.  Glenn Conrad 40 y.o. male presents to office today for follow up to HTN.  He has a past medical history of DM, Hyperlipidemia, Hypertension, PE, Right leg DVT ([place]), and Thyroid disease.   Being followed by oncology for history of  DVT/PE. Taking eliquis which was decreased from 71m to 192min May.   HTN Blood pressure is well controlled. Currently taking amlodipine 10 mg. Denies chest pain, shortness of breath, palpitations, lightheadedness, dizziness, headaches or BLE edema.   BP Readings from Last 3 Encounters:  05/27/21 129/78  03/30/21 116/74  02/23/21 107/64     DM 2 Taking jardiance 10 mg daily as prescribed. Well controlled.LDL at goal with atorvastatin 20 mg daily as prescribed.  Lab Results  Component Value Date   HGBA1C 5.9 (H) 02/23/2021    Lab Results   Component Value Date   LDLCALC 45 11/01/2020    Hypothyroidism Elevated. Taking levothyroxine 100 mg daily as prescribed. Denies any symptoms of hypo or hyperthyroidism Lab Results  Component Value Date   TSH 5.680 (H) 11/01/2020    Review of Systems  Constitutional:  Negative for fever, malaise/fatigue and weight loss.  HENT: Negative.  Negative for nosebleeds.   Eyes: Negative.  Negative for blurred vision, double vision and photophobia.  Respiratory: Negative.  Negative for cough and shortness of breath.   Cardiovascular: Negative.  Negative for chest pain, palpitations and leg swelling.  Gastrointestinal: Negative.  Negative for heartburn, nausea and vomiting.  Musculoskeletal:  Positive for back pain. Negative for myalgias.  Neurological: Negative.  Negative for dizziness, focal weakness, seizures and headaches.  Psychiatric/Behavioral: Negative.  Negative for suicidal ideas.    Past Medical History:  Diagnosis Date   Diabetes mellitus without complication ([place]   Hyperlipidemia    Hypertension    PE (pulmonary throm[place]embolism) ([facility]   [place] D[place]   Thyroid disease     Past Surgical History:  Procedure Laterality Date   WISDOM TOOTH EXTRACTION      Family History  Problem Relation Age of Onset   Diabetes Mother    Hypertension Mother    Hypertension Father    Diabetes Father     Social History Reviewed with no changes to be made today.   Outpatient Medications Prior to Visit  Medication Sig Dispense Refill   Blood Glucose Monitoring Suppl (TRUE METRIX METER) w/Device KIT Use as instructed. Check blood glucose level by fingerstick twice per day.  E11.65 1 kit 0   levothyroxine (SYNTHROID) 100 MCG tablet TAKE 1 TABLET (100 MCG TOTAL) BY MOUTH DAILY. 90 tablet 1   allopurinol (ZYLOPRIM) 100 MG tablet TAKE 1 TABLET (100 MG TOTAL) BY MOUTH DAILY. 30 tablet 0   amLODipine (NORVASC) 5 MG tablet Take 1 tablet (5 mg total) by mouth daily. 30 tablet 1    atorvastatin (LIPITOR) 20 MG tablet TAKE 1 TABLET (20 MG TOTAL) BY MOUTH DAILY. 90 tablet 3   baclofen (LIORESAL) 10 MG tablet TAKE 1 TABLET (10 MG TOTAL) BY MOUTH 3 (THREE) TIMES DAILY AS NEEDED FOR MUSCLE SPASMS. 15 tablet 0   empagliflozin (JARDIANCE) 10 MG TABS tablet Take 1 tablet (10 mg total) by mouth daily before breakfast. 90 tablet 0   glucose blood test strip USE AS INSTRUCTED. CHECK BLOOD GLUCOSE LEVEL BY FINGERSTICK TWICE PER DAY. (Patient taking differently: USE AS INSTRUCTED. CHECK BLOOD GLUCOSE LEVEL BY FINGERSTICK TWICE PER DAY.) 100 strip 12   rivaroxaban (XARELTO) 10 MG TABS tablet TAKE 1 TABLET (10 MG TOTAL) BY MOUTH DAILY. 90 tablet 3   TRUEplus Lancets 28G MISC USE AS INSTRUCTED. CHECK BLOOD GLUCOSE LEVEL BY FINGERSTICK TWICE PER DAY. 100 each 3   colchicine 0.6 MG tablet Day 1: Take 0.6 mg 3 times daily  on the first day of flare. Initiate as soon as possible, ideally within 12 to 24 hours of flare onset. Day 2 and thereafter:Take 0.6 mg once or twice daily until flare resolves then continue for 2 to 3 days after flare resolves (Patient not taking: Reported on 05/27/2021) 30 tablet 0   lidocaine (LIDODERM) 5 % PLACE 1 PATCH ONTO THE SKIN DAILY. (Patient not taking: Reported on 05/27/2021) 30 patch 0   No facility-administered medications prior to visit.    Allergies  Allergen Reactions   Glipizide Diarrhea   Lisinopril     Headaches    Metformin And Related Diarrhea       Objective:    BP 129/78   Pulse 60   Ht 6' 1" (1.854 m)   Wt 248 lb (112.5 kg)   SpO2 99%   BMI 32.72 kg/m  Wt Readings from Last 3 Encounters:  05/27/21 248 lb (112.5 kg)  02/23/21 252 lb (114.3 kg)  12/22/20 258 lb 6.4 oz (117.2 kg)    Physical Exam Vitals and nursing note reviewed.  Constitutional:      Appearance: He is well-developed.  HENT:     Head: Normocephalic and atraumatic.  Cardiovascular:     Rate and Rhythm: Normal rate and regular rhythm.     Heart sounds: Normal  heart sounds. No murmur heard.   No friction rub. No gallop.  Pulmonary:     Effort: Pulmonary effort is normal. No tachypnea or respiratory distress.     Breath sounds: Normal breath sounds. No decreased breath sounds, wheezing, rhonchi or rales.  Chest:     Chest wall: No tenderness.  Abdominal:     General: Bowel sounds are normal.     Palpations: Abdomen is soft.  Musculoskeletal:        General: Normal range of motion.     Cervical back: Normal range of motion.  Skin:    General: Skin is warm and dry.  Neurological:     Mental Status: He is alert and oriented to person, place, and time.     Coordination: Coordination normal.  Psychiatric:        Behavior: Behavior normal. Behavior is cooperative.        Thought Content: Thought content normal.        Judgment: Judgment normal.         Patient has been counseled extensively about nutrition and exercise as well as the importance of adherence with medications and regular follow-up. The patient was given clear instructions to go to ER or return to medical center if symptoms don't improve, worsen or new problems develop. The patient verbalized understanding.   Follow-up: Return in about 3 months (around 08/27/2021).   [name], FNP-BC [facility] [place], [place]   05/27/2021, 3:41 PM

## 2021-05-28 LAB — MICROALBUMIN / CREATININE URINE RATIO
Creatinine, Urine: 126.2 mg/dL
Microalb/Creat Ratio: <2 mg/g creat (ref 0–29)
Microalbumin, Urine: <3 ug/mL

## 2021-06-08 ENCOUNTER — Other Ambulatory Visit: Payer: Self-pay

## 2021-06-09 ENCOUNTER — Other Ambulatory Visit: Payer: Self-pay

## 2021-06-17 ENCOUNTER — Telehealth: Payer: Self-pay | Admitting: Hematology and Oncology

## 2021-06-17 NOTE — Telephone Encounter (Signed)
Called patient regarding 11/11 appointment, patient has been called and voicemail was left.

## 2021-06-24 ENCOUNTER — Other Ambulatory Visit: Payer: Self-pay

## 2021-06-24 ENCOUNTER — Ambulatory Visit: Payer: Self-pay | Admitting: Hematology and Oncology

## 2021-06-24 ENCOUNTER — Inpatient Hospital Stay: Payer: Self-pay

## 2021-06-24 ENCOUNTER — Inpatient Hospital Stay: Payer: Self-pay | Admitting: Hematology and Oncology

## 2021-06-27 ENCOUNTER — Other Ambulatory Visit: Payer: Self-pay

## 2021-06-28 ENCOUNTER — Ambulatory Visit: Payer: Self-pay | Admitting: Hematology and Oncology

## 2021-06-28 ENCOUNTER — Other Ambulatory Visit: Payer: Self-pay

## 2021-06-29 ENCOUNTER — Other Ambulatory Visit: Payer: Self-pay

## 2021-07-06 ENCOUNTER — Encounter: Payer: Self-pay | Admitting: Nurse Practitioner

## 2021-07-12 ENCOUNTER — Inpatient Hospital Stay: Payer: Self-pay | Attending: Hematology and Oncology

## 2021-07-12 ENCOUNTER — Other Ambulatory Visit: Payer: Self-pay

## 2021-07-12 ENCOUNTER — Inpatient Hospital Stay (HOSPITAL_BASED_OUTPATIENT_CLINIC_OR_DEPARTMENT_OTHER): Payer: Self-pay | Admitting: Hematology and Oncology

## 2021-07-12 VITALS — BP 122/72 | HR 53 | Temp 96.7°F | Resp 17 | Wt 251.5 lb

## 2021-07-12 DIAGNOSIS — Z7901 Long term (current) use of anticoagulants: Secondary | ICD-10-CM | POA: Insufficient documentation

## 2021-07-12 DIAGNOSIS — I82461 Acute embolism and thrombosis of right calf muscular vein: Secondary | ICD-10-CM

## 2021-07-12 DIAGNOSIS — Z7984 Long term (current) use of oral hypoglycemic drugs: Secondary | ICD-10-CM | POA: Insufficient documentation

## 2021-07-12 DIAGNOSIS — F17210 Nicotine dependence, cigarettes, uncomplicated: Secondary | ICD-10-CM | POA: Insufficient documentation

## 2021-07-12 DIAGNOSIS — Z86718 Personal history of other venous thrombosis and embolism: Secondary | ICD-10-CM | POA: Insufficient documentation

## 2021-07-12 DIAGNOSIS — I2609 Other pulmonary embolism with acute cor pulmonale: Secondary | ICD-10-CM

## 2021-07-12 DIAGNOSIS — Z86711 Personal history of pulmonary embolism: Secondary | ICD-10-CM | POA: Insufficient documentation

## 2021-07-12 DIAGNOSIS — Z79899 Other long term (current) drug therapy: Secondary | ICD-10-CM | POA: Insufficient documentation

## 2021-07-12 LAB — CBC WITH DIFFERENTIAL (CANCER CENTER ONLY)
Abs Immature Granulocytes: 0.01 10*3/uL (ref 0.00–0.07)
Basophils Absolute: 0 10*3/uL (ref 0.0–0.1)
Basophils Relative: 1 %
Eosinophils Absolute: 0.1 10*3/uL (ref 0.0–0.5)
Eosinophils Relative: 1 %
HCT: 42.4 % (ref 39.0–52.0)
Hemoglobin: 14.3 g/dL (ref 13.0–17.0)
Immature Granulocytes: 0 %
Lymphocytes Relative: 39 %
Lymphs Abs: 1.7 10*3/uL (ref 0.7–4.0)
MCH: 30.9 pg (ref 26.0–34.0)
MCHC: 33.7 g/dL (ref 30.0–36.0)
MCV: 91.6 fL (ref 80.0–100.0)
Monocytes Absolute: 0.4 10*3/uL (ref 0.1–1.0)
Monocytes Relative: 9 %
Neutro Abs: 2.1 10*3/uL (ref 1.7–7.7)
Neutrophils Relative %: 50 %
Platelet Count: 182 10*3/uL (ref 150–400)
RBC: 4.63 MIL/uL (ref 4.22–5.81)
RDW: 12.6 % (ref 11.5–15.5)
WBC Count: 4.2 10*3/uL (ref 4.0–10.5)
nRBC: 0 % (ref 0.0–0.2)

## 2021-07-12 LAB — CMP (CANCER CENTER ONLY)
ALT: 52 U/L — ABNORMAL HIGH (ref 0–44)
AST: 25 U/L (ref 15–41)
Albumin: 4.4 g/dL (ref 3.5–5.0)
Alkaline Phosphatase: 49 U/L (ref 38–126)
Anion gap: 6 (ref 5–15)
BUN: 15 mg/dL (ref 6–20)
CO2: 28 mmol/L (ref 22–32)
Calcium: 9.2 mg/dL (ref 8.9–10.3)
Chloride: 105 mmol/L (ref 98–111)
Creatinine: 0.91 mg/dL (ref 0.61–1.24)
GFR, Estimated: >60 mL/min (ref >60)
Glucose, Bld: 106 mg/dL — ABNORMAL HIGH (ref 70–99)
Potassium: 3.3 mmol/L — ABNORMAL LOW (ref 3.5–5.1)
Sodium: 139 mmol/L (ref 135–145)
Total Bilirubin: 0.9 mg/dL (ref 0.3–1.2)
Total Protein: 7.4 g/dL (ref 6.5–8.1)

## 2021-07-12 NOTE — Progress Notes (Signed)
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 CT PE study showed acute pulmonary infarction in the right lung, most severe in the right lower lobe where there is an occlusive embolus and probable area of pulmonary hemorrhage in the posterior aspect of the right lower lobe from pulmonary infarction. 1) 12/08/2019:Bilateral LE US showed findings consistent with acute deep vein thrombosis involving the right  gastrocnemius veins. No DVT noted in the left leg. Started on Xarelto therapy.  2) 01/02/2020: establish care with Dr. Lorenso Courier    Interval History:  Glenn Conrad 40 y.o. male with medical history significant for Pulmonary Embolism/ Right Lower Extremity DVT presents for a follow up visit. The patient's last visit was on 12/22/2020. In the interim since the last visit he had no major changes in his health, though he did have an emergency room visit for leg pain on 07/03/2021 with a lower extremity US performed on the right leg showing no evidence of VTE.  On exam today [name] notes he is continued on Xarelto 10 mg p.o. daily without any difficulty.  He denies any bleeding, bruising, or dark stools.  He notes that the cost of medication is just fine.  He denies any chest pain or shortness of breath.  Unfortunately he is having right leg pain.  He [facility] the emergency department at [facility] on 07/03/2021 in order to have this looked at.  Ultrasound was performed which showed no evidence of recurrent or residual VTE.  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  He notes that the pain is mostly in the shin and starts  in the ankle and radiates upward.  There is some moderate swelling as well.  He does have a history of gout but he reports that only his gout is in his hand that is new he has never had it before in his ankle.  He currently denies having any issues with fevers, chills, sweats, nausea, vomiting or diarrhea.  A full 10 point ROS is listed below.  MEDICAL HISTORY:  Past Medical History:  Diagnosis Date   Diabetes mellitus without complication (HCC)    Hyperlipidemia    Hypertension    PE (pulmonary thromboembolism) (HCC)    Right leg DVT (HCC)    Thyroid disease     SURGICAL HISTORY: Past Surgical History:  Procedure Laterality Date   WISDOM TOOTH EXTRACTION      SOCIAL HISTORY: Social History   Socioeconomic History   Marital status: Single    Spouse name: Not on file   Number of children: Not on file   Years of education: Not on file   Highest education level: Not on file  Occupational History   Not on file  Tobacco Use   Smoking status: Every Day    Packs/day: 0.50    Years: 25.00    Pack years: 12.50    Types: Cigarettes   Smokeless tobacco: Never  Vaping Use   Vaping Use: Never used  Substance and Sexual Activity   Alcohol use: No   Drug use: No   Sexual activity: Yes  Other Topics Concern   Not on file  Social History Narrative   Not on file   Social Determinants of Health   Financial Resource Strain: Not on file  Food Insecurity: Not on file  Transportation Needs: Not on file  Physical Activity: Not on file  Stress: Not on file  Social Connections: Not on file  Intimate Partner Violence: Not on file    FAMILY HISTORY: Family History  Problem Relation Age of Onset   Diabetes Mother    Hypertension Mother    Hypertension Father    Diabetes Father     ALLERGIES:  is allergic to glipizide, lisinopril, and metformin and related.  MEDICATIONS:  Current Outpatient Medications  Medication Sig Dispense Refill   allopurinol (ZYLOPRIM) 100 MG tablet  Take 1 tablet (100 mg total) by mouth daily. 90 tablet 1   amLODipine (NORVASC) 5 MG tablet Take 1 tablet (5 mg total) by mouth daily. 30 tablet 1   atorvastatin (LIPITOR) 20 MG tablet TAKE 1 TABLET (20 MG TOTAL) BY MOUTH DAILY. 90 tablet 3   baclofen (LIORESAL) 10 MG tablet TAKE 1 TABLET (10 MG TOTAL) BY MOUTH 3 (THREE) TIMES DAILY AS NEEDED FOR MUSCLE SPASMS. 15 tablet 0   Blood Glucose Monitoring Suppl (TRUE METRIX METER) w/Device KIT Use as instructed. Check blood glucose level by fingerstick twice per day.  E11.65 1 kit 0   colchicine 0.6 MG tablet Day 1: Take 0.6 mg 3 times daily on the first day of flare. Initiate as soon as possible, ideally within 12 to 24 hours of flare onset. Day 2 and thereafter:Take 0.6 mg once or twice daily until flare resolves then continue for 2 to 3 days after flare resolves 30 tablet 0   empagliflozin (JARDIANCE) 10 MG TABS tablet Take 1 tablet (10 mg total) by mouth daily before breakfast. 90 tablet 1   glucose blood test strip USE AS INSTRUCTED. CHECK BLOOD GLUCOSE LEVEL BY FINGERSTICK TWICE PER DAY. 100 strip 12   levothyroxine (SYNTHROID) 100 MCG tablet TAKE 1 TABLET (100 MCG TOTAL) BY MOUTH DAILY. 90 tablet 1   lidocaine (LIDODERM) 5 % PLACE 1 PATCH ONTO THE SKIN DAILY FOR UP TO 12 HOURS IN A 24 HOUR PERIOD 30 patch 0   rivaroxaban (XARELTO) 10 MG TABS tablet TAKE 1 TABLET (10 MG TOTAL) BY MOUTH DAILY. 90 tablet 3   TRUEplus Lancets 28G MISC USE AS INSTRUCTED. CHECK BLOOD GLUCOSE LEVEL BY FINGERSTICK TWICE PER DAY. 100 each 3   No current facility-administered medications for this visit.    REVIEW OF SYSTEMS:   Constitutional: ( - ) fevers, ( - )  chills , ( - ) night sweats Eyes: ( - ) blurriness of vision, ( - ) double vision, ( - ) watery eyes Ears, nose, mouth, throat, and face: ( - ) mucositis, ( - ) sore throat Respiratory: ( - ) cough, ( - ) dyspnea, ( - ) wheezes Cardiovascular: ( - ) palpitation, ( - ) chest discomfort, ( - ) lower extremity  swelling Gastrointestinal:  ( - ) nausea, ( - ) heartburn, ( - ) change in bowel habits Skin: ( - ) abnormal skin rashes Lymphatics: ( - ) new lymphadenopathy, ( - ) easy bruising Neurological: ( - ) numbness, ( - ) tingling, ( - ) new weaknesses Behavioral/Psych: ( - ) mood change, ( - ) new changes  All other systems were reviewed with the patient and are negative.  PHYSICAL EXAMINATION: ECOG PERFORMANCE STATUS: 0 - Asymptomatic  Vitals:   07/12/21 0940  BP: 122/72  Pulse: (!) 53  Resp: 17  Temp: (!) 96.7 F (35.9 C)  SpO2: 98%   Filed Weights   07/12/21 0940  Weight: 251 lb 8 oz (114.1 kg)    GENERAL: well appearing middle aged African-American male in no acute distress. alert, no distress and comfortable SKIN: skin color, texture, turgor are normal, no rashes or significant lesions EYES: conjunctiva are pink and non-injected, sclera clear LUNGS: clear to auscultation and percussion with normal breathing effort HEART: regular rate & rhythm and no murmurs and no lower extremity edema Musculoskeletal: no cyanosis of digits and no clubbing  PSYCH: alert & oriented x 3, fluent speech NEURO: no focal motor/sensory deficits  LABORATORY DATA:  I have reviewed the data as listed CBC Latest Ref Rng & Units 07/12/2021 12/22/2020 10/26/2020  WBC 4.0 - 10.5 K/uL 4.2 4.6 5.5  Hemoglobin 13.0 - 17.0 g/dL 14.3 14.1 14.0  Hematocrit 39.0 - 52.0 % 42.4 42.6 43.5  Platelets 150 - 400 K/uL 182 177 193    CMP Latest Ref Rng & Units 07/12/2021 02/23/2021 12/22/2020  Glucose 70 - 99 mg/dL 106(H) 80 103(H)  BUN 6 - 20 mg/dL 15 12 12   Creatinine 0.61 - 1.24 mg/dL 0.91 1.00 1.01  Sodium 135 - 145 mmol/L 139 144 142  Potassium 3.5 - 5.1 mmol/L 3.3(L) 4.3 3.9  Chloride 98 - 111 mmol/L 105 103 102  CO2 22 - 32 mmol/L 28 26 31   Calcium 8.9 - 10.3 mg/dL 9.2 9.7 9.7  Total Protein 6.5 - 8.1 g/dL 7.4 7.3 7.0  Total Bilirubin 0.3 - 1.2 mg/dL 0.9 0.6 0.8  Alkaline Phos 38 - 126 U/L 49 58 52  AST  15 - 41 U/L 25 21 25   ALT 0 - 44 U/L 52(H) 39 48(H)    RADIOGRAPHIC STUDIES: No results found.  ASSESSMENT & PLAN Glenn Conrad 40 y.o. male with medical history significant for Pulmonary Embolism/ Right Lower Extremity DVT presents for a follow up visit.   On exam today Mr. Hu notes he has been compliant with his medication has had no bleeding, major bruising, or dark stools.  He is also having no clear evidence of recurrent VTE.  Previously we discussed decreasing the dose of his Xarelto from 20 mg p.o. daily down to 10 mg p.o. daily as he has completed a full 6 months of therapy.  After discussing the risks and benefits of this transition the patient was agreeable to transitioning down to 10 mg p.o. daily.  We will plan to see him back in approximately 6 months time to see how he is tolerating the treatment.  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  --no clear evidence of a hypercoagulable disorder based on evaluation performed 12/07/2019.  --patient is agreeable to continuing 30m PO daily Xarelto --RTC in 6 months time to re-evaluate or sooner if new symptoms arise.   # Right Ankle/Leg Pain -- Patient recently underwent repeat ultrasound lower extremity on 07/03/2021 with no evidence of residual recurrent VTE. --Symptoms may represent post thrombotic syndrome, though findings are atypical for that diagnosis. --Unclear if this is related to gout or other musculoskeletal issue.  We will make referral to orthopedics for further evaluation.  Orders Placed This Encounter  Procedures   Ambulatory referral to Orthopedic Surgery    Referral Priority:   Routine    Referral Type:  Surgical    Referral Reason:   Specialty Services Required    Requested Specialty:   Orthopedic Surgery    Number of Visits Requested:   1      All questions were answered. The patient knows to call the clinic with any problems, questions or concerns.  A total of more than 30 minutes were spent on this encounter and over half of that time was spent on counseling and coordination of care as outlined above.   Ledell Peoples, MD Department of Hematology/Oncology [place] [facility] Phone: [phone] Pager: [phone] Email: [email or web address]  07/12/2021 1:02 PM

## 2021-07-21 ENCOUNTER — Other Ambulatory Visit: Payer: Self-pay

## 2021-07-25 ENCOUNTER — Other Ambulatory Visit: Payer: Self-pay

## 2021-08-11 ENCOUNTER — Other Ambulatory Visit: Payer: Self-pay

## 2021-08-26 ENCOUNTER — Ambulatory Visit: Payer: Self-pay | Attending: Nurse Practitioner | Admitting: Nurse Practitioner

## 2021-08-26 ENCOUNTER — Telehealth: Payer: Self-pay | Admitting: Nurse Practitioner

## 2021-08-26 ENCOUNTER — Encounter: Payer: Self-pay | Admitting: Nurse Practitioner

## 2021-08-26 DIAGNOSIS — E1165 Type 2 diabetes mellitus with hyperglycemia: Secondary | ICD-10-CM

## 2021-08-26 DIAGNOSIS — I10 Essential (primary) hypertension: Secondary | ICD-10-CM

## 2021-08-26 NOTE — Telephone Encounter (Signed)
Pt stated he cannot do his video visit today FYI

## 2021-08-26 NOTE — Progress Notes (Signed)
Virtual Visit via Telephone Note Due to national recommendations of social distancing due to COVID 19, telehealth visit is felt to be most appropriate for this patient at this time.  I discussed the limitations, risks, security and privacy concerns of performing an evaluation and management service by telephone and the availability of in person appointments. I also discussed with the patient that there may be a patient responsible charge related to this service. The patient expressed understanding and agreed to proceed.    I connected with Glenn Conrad on 08/26/21  at   3:10 PM EST  EDT by telephone and verified that I am speaking with the correct person using two identifiers.  Location of Patient: Private Residence   [facility] Provider[facility]    Persons participating in Telemedicine visit: Bertram Denver FNP-BC Henoch Ungerman    History of Present Illness: Telemedicine visit for: HTN and DM He has a past medical history of DM, Hyperlipidemia, Hypertension, PE, Right leg DVT (HCC), and Thyroid disease.    HTN Blood pressure is well controlled. Last recorded home reading:130/70. He is taking amlodipein 10 mg daily as prescribed. Hoping to "come off some of these medicines" BP Readings from Last 3 Encounters:  07/12/21 122/72  05/27/21 129/78  03/30/21 116/74    DM 2 Well controlled with Jardiance 10 mg daily. He does not monitor his home glucose levels often.  Lab Results  Component Value Date   HGBA1C 5.9 (H) 02/23/2021    Hypothyroidism Not at goal. Will recheck thyroid level with blood work. He denies any symptoms of hypo or hyperthyroidism and endorses adherence taking levothyroxine 100 mg daily as prescribed.  Lab Results  Component Value Date   TSH 5.680 (H) 11/01/2020      Past Medical History:  Diagnosis Date   Diabetes mellitus without complication (HCC)    Hyperlipidemia    Hypertension    PE (pulmonary thromboembolism)  (HCC)    Right leg DVT (HCC)    Thyroid disease     Past Surgical History:  Procedure Laterality Date   WISDOM TOOTH EXTRACTION      Family History  Problem Relation Age of Onset   Diabetes Mother    Hypertension Mother    Hypertension Father    Diabetes Father     Social History   Socioeconomic History   Marital status: Single    Spouse name: Not on file   Number of children: Not on file   Years of education: Not on file   Highest education level: Not on file  Occupational History   Not on file  Tobacco Use   Smoking status: Every Day    Packs/day: 0.50    Years: 25.00    Pack years: 12.50    Types: Cigarettes   Smokeless tobacco: Never  Vaping Use   Vaping Use: Never used  Substance and Sexual Activity   Alcohol use: No   Drug use: No   Sexual activity: Yes  Other Topics Concern   Not on file  Social History Narrative   Not on file   Social Determinants of Health   Financial Resource Strain: Not on file  Food Insecurity: Not on file  Transportation Needs: Not on file  Physical Activity: Not on file  Stress: Not on file  Social Connections: Not on file     Observations/Objective: Awake, alert and oriented x 3   Review of Systems  Constitutional:  Negative for fever, malaise/fatigue and weight loss.  HENT: Negative.  Negative for nosebleeds.   Eyes: Negative.  Negative for blurred vision, double vision and photophobia.  Respiratory: Negative.  Negative for cough and shortness of breath.   Cardiovascular: Negative.  Negative for chest pain, palpitations and leg swelling.  Gastrointestinal: Negative.  Negative for heartburn, nausea and vomiting.  Musculoskeletal: Negative.  Negative for myalgias.  Neurological: Negative.  Negative for dizziness, focal weakness, seizures and headaches.  Psychiatric/Behavioral: Negative.  Negative for suicidal ideas.    Assessment and Plan: Diagnoses and all orders for this visit:  Primary hypertension Continue all  antihypertensives as prescribed.  Remember to bring in your blood pressure log with you for your follow up appointment.      Type 2 diabetes mellitus with hyperglycemia, without long-term current use of insulin (HCC) Keep blood sugar logs with fasting goal of 90-130 mg/dl, post prandial (after you eat) less than 180.  For Hypoglycemia: BS <60 and Hyperglycemia BS >400; contact the clinic ASAP.      Follow Up Instructions Return in about 3 months (around 11/24/2021).     I discussed the assessment and treatment plan with the patient. The patient was provided an opportunity to ask questions and all were answered. The patient agreed with the plan and demonstrated an understanding of the instructions.   The patient was advised to call back or seek an in-person evaluation if the symptoms worsen or if the condition fails to improve as anticipated.  I provided 11 minutes of non-face-to-face time during this encounter including median intraservice time, reviewing previous notes, labs, imaging, medications and explaining diagnosis and management.  [name], FNP-BC

## 2021-08-26 NOTE — Telephone Encounter (Signed)
No answer. LVM ?

## 2021-09-07 ENCOUNTER — Other Ambulatory Visit: Payer: Self-pay | Admitting: Nurse Practitioner

## 2021-09-07 DIAGNOSIS — E039 Hypothyroidism, unspecified: Secondary | ICD-10-CM

## 2021-09-08 ENCOUNTER — Other Ambulatory Visit: Payer: Self-pay

## 2021-09-08 NOTE — Telephone Encounter (Signed)
Requesting early.  Requested Prescriptions  Pending Prescriptions Disp Refills   levothyroxine (SYNTHROID) 100 MCG tablet 90 tablet 1    Sig: TAKE 1 TABLET (100 MCG TOTAL) BY MOUTH DAILY.     Endocrinology:  Hypothyroid Agents Failed - 09/07/2021 10:21 PM      Failed - TSH needs to be rechecked within 3 months after an abnormal result. Refill until TSH is due.      Failed - TSH in normal range and within 360 days    TSH  Date Value Ref Range Status  11/01/2020 5.680 (H) 0.450 - 4.500 uIU/mL Final         Passed - Valid encounter within last 12 months    Recent Outpatient Visits          1 week ago Primary hypertension   [facility] [place], Shea Stakes, NP   3 months ago Primary hypertension   [place] [facility] [place], [place] W, NP   5 months ago Essential hypertension   [facility] [place], Jeannett Senior L, RPH-CPP   6 months ago Type 2 diabetes mellitus with hyperglycemia, without long-term current use of insulin [facility])   [place] [facility] [place], [place] W, NP   10 months ago Type 2 diabetes mellitus with hyperglycemia, without long-term current use of insulin [facility] [place] [facility])   [facility] [place], Shea Stakes, NP

## 2021-09-09 ENCOUNTER — Encounter: Payer: Self-pay | Admitting: Nurse Practitioner

## 2021-09-09 ENCOUNTER — Other Ambulatory Visit: Payer: Self-pay

## 2021-09-09 ENCOUNTER — Other Ambulatory Visit: Payer: Self-pay | Admitting: Nurse Practitioner

## 2021-09-09 DIAGNOSIS — E039 Hypothyroidism, unspecified: Secondary | ICD-10-CM

## 2021-09-09 MED ORDER — LEVOTHYROXINE SODIUM 100 MCG PO TABS
ORAL_TABLET | Freq: Every day | ORAL | 0 refills | Status: DC
Start: 2021-09-09 — End: 2021-10-14
  Filled 2021-09-09: qty 30, 30d supply, fill #0

## 2021-09-12 ENCOUNTER — Other Ambulatory Visit: Payer: Self-pay

## 2021-09-22 ENCOUNTER — Other Ambulatory Visit: Payer: Self-pay

## 2021-09-23 ENCOUNTER — Other Ambulatory Visit: Payer: Self-pay | Admitting: Nurse Practitioner

## 2021-09-23 ENCOUNTER — Other Ambulatory Visit: Payer: Self-pay

## 2021-09-23 DIAGNOSIS — I10 Essential (primary) hypertension: Secondary | ICD-10-CM

## 2021-09-23 MED ORDER — AMLODIPINE BESYLATE 5 MG PO TABS
5.0000 mg | ORAL_TABLET | Freq: Every day | ORAL | 2 refills | Status: DC
  Filled 2021-09-23: qty 30, 30d supply, fill #0

## 2021-09-29 ENCOUNTER — Other Ambulatory Visit: Payer: Self-pay

## 2021-10-10 ENCOUNTER — Other Ambulatory Visit: Payer: Self-pay

## 2021-10-11 ENCOUNTER — Other Ambulatory Visit: Payer: Self-pay | Admitting: Nurse Practitioner

## 2021-10-11 ENCOUNTER — Other Ambulatory Visit: Payer: Self-pay

## 2021-10-11 ENCOUNTER — Telehealth: Payer: Self-pay

## 2021-10-11 DIAGNOSIS — E1165 Type 2 diabetes mellitus with hyperglycemia: Secondary | ICD-10-CM

## 2021-10-11 DIAGNOSIS — E785 Hyperlipidemia, unspecified: Secondary | ICD-10-CM

## 2021-10-11 NOTE — Telephone Encounter (Signed)
Copied from CRM [id number]. Topic: Appointment Scheduling - Scheduling Inquiry for Clinic >> Oct 10, 2021  4:09 PM Glean Salen wrote: Reason for CRM:Pt called in needs orders for labs

## 2021-10-14 ENCOUNTER — Other Ambulatory Visit: Payer: Self-pay | Admitting: Nurse Practitioner

## 2021-10-14 ENCOUNTER — Ambulatory Visit: Payer: Self-pay | Attending: Nurse Practitioner

## 2021-10-14 ENCOUNTER — Other Ambulatory Visit: Payer: Self-pay

## 2021-10-14 ENCOUNTER — Encounter: Payer: Self-pay | Admitting: Nurse Practitioner

## 2021-10-14 DIAGNOSIS — E039 Hypothyroidism, unspecified: Secondary | ICD-10-CM

## 2021-10-14 DIAGNOSIS — E785 Hyperlipidemia, unspecified: Secondary | ICD-10-CM

## 2021-10-14 DIAGNOSIS — E1165 Type 2 diabetes mellitus with hyperglycemia: Secondary | ICD-10-CM

## 2021-10-14 DIAGNOSIS — Z86711 Personal history of pulmonary embolism: Secondary | ICD-10-CM

## 2021-10-14 DIAGNOSIS — M1A9XX0 Chronic gout, unspecified, without tophus (tophi): Secondary | ICD-10-CM

## 2021-10-14 DIAGNOSIS — M545 Low back pain, unspecified: Secondary | ICD-10-CM

## 2021-10-14 DIAGNOSIS — E876 Hypokalemia: Secondary | ICD-10-CM

## 2021-10-14 DIAGNOSIS — I10 Essential (primary) hypertension: Secondary | ICD-10-CM

## 2021-10-14 MED ORDER — AMLODIPINE BESYLATE 5 MG PO TABS
5.0000 mg | ORAL_TABLET | Freq: Every day | ORAL | 2 refills | Status: DC
  Filled 2021-10-14: qty 30, 30d supply, fill #0
  Filled 2021-12-18: qty 30, 30d supply, fill #1
  Filled 2022-01-25: qty 30, 30d supply, fill #2

## 2021-10-14 MED ORDER — ATORVASTATIN CALCIUM 20 MG PO TABS
ORAL_TABLET | Freq: Every day | ORAL | 3 refills | Status: AC
  Filled 2021-10-14: qty 90, fill #0

## 2021-10-14 MED ORDER — RIVAROXABAN 10 MG PO TABS
ORAL_TABLET | Freq: Every day | ORAL | 3 refills | Status: AC
Start: 2021-10-14 — End: 2022-11-14
  Filled 2021-10-14: qty 90, fill #0
  Filled 2021-12-18: qty 7, 7d supply, fill #0
  Filled 2021-12-29: qty 7, 7d supply, fill #1
  Filled 2022-01-02: qty 30, 30d supply, fill #2
  Filled 2022-01-25: qty 30, 30d supply, fill #3
  Filled 2022-03-01: qty 30, 30d supply, fill #4
  Filled 2022-03-31: qty 30, 30d supply, fill #5
  Filled 2022-04-07 – 2022-05-04 (×2): qty 30, 30d supply, fill #6
  Filled 2022-06-03: qty 30, 30d supply, fill #7
  Filled 2022-07-03 – 2022-07-14 (×2): qty 30, 30d supply, fill #8
  Filled 2022-08-15: qty 30, 30d supply, fill #9
  Filled 2022-09-14: qty 30, 30d supply, fill #10
  Filled 2022-10-12: qty 30, 30d supply, fill #11

## 2021-10-14 MED ORDER — GLUCOSE BLOOD VI STRP
ORAL_STRIP | 12 refills | Status: AC
  Filled 2021-10-14: qty 100, 50d supply, fill #0

## 2021-10-14 MED ORDER — TRUEPLUS LANCETS 28G MISC
3 refills | Status: AC
  Filled 2021-10-14: qty 100, 50d supply, fill #0

## 2021-10-14 MED ORDER — EMPAGLIFLOZIN 10 MG PO TABS
10.0000 mg | ORAL_TABLET | Freq: Every day | ORAL | 1 refills | Status: AC
Start: 2021-10-14 — End: ?
  Filled 2021-10-14 – 2022-05-04 (×3): qty 90, 90d supply, fill #0
  Filled 2022-06-03: qty 30, 30d supply, fill #0

## 2021-10-14 MED ORDER — LEVOTHYROXINE SODIUM 100 MCG PO TABS
ORAL_TABLET | Freq: Every day | ORAL | 3 refills | Status: DC
  Filled 2021-10-14: qty 30, 30d supply, fill #0
  Filled 2021-11-16: qty 30, 30d supply, fill #1
  Filled 2021-12-18 – 2021-12-19 (×2): qty 30, 30d supply, fill #2
  Filled 2022-01-25: qty 30, 30d supply, fill #3

## 2021-10-14 MED ORDER — ALLOPURINOL 100 MG PO TABS
100.0000 mg | ORAL_TABLET | Freq: Every day | ORAL | 1 refills | Status: DC
  Filled 2021-10-14 – 2021-11-07 (×2): qty 90, 90d supply, fill #0
  Filled 2022-01-25: qty 90, 90d supply, fill #1

## 2021-10-15 LAB — CMP14+EGFR
ALT: 46 IU/L — ABNORMAL HIGH (ref 0–44)
AST: 22 IU/L (ref 0–40)
Albumin/Globulin Ratio: 1.8 (ref 1.2–2.2)
Albumin: 4.6 g/dL (ref 4.0–5.0)
Alkaline Phosphatase: 66 IU/L (ref 44–121)
BUN/Creatinine Ratio: 14 (ref 9–20)
BUN: 13 mg/dL (ref 6–24)
Bilirubin Total: 0.4 mg/dL (ref 0.0–1.2)
CO2: 28 mmol/L (ref 20–29)
Calcium: 9.3 mg/dL (ref 8.7–10.2)
Chloride: 102 mmol/L (ref 96–106)
Creatinine, Ser: 0.92 mg/dL (ref 0.76–1.27)
Globulin, Total: 2.6 g/dL (ref 1.5–4.5)
Glucose: 75 mg/dL (ref 70–99)
Potassium: 4 mmol/L (ref 3.5–5.2)
Sodium: 144 mmol/L (ref 134–144)
Total Protein: 7.2 g/dL (ref 6.0–8.5)
eGFR: 107 mL/min/{1.73_m2} (ref >59)

## 2021-10-15 LAB — LIPID PANEL
Chol/HDL Ratio: 2.5 ratio (ref 0.0–5.0)
Cholesterol, Total: 137 mg/dL (ref 100–199)
HDL: 55 mg/dL (ref >39)
LDL Chol Calc (NIH): 59 mg/dL (ref 0–99)
Triglycerides: 132 mg/dL (ref 0–149)
VLDL Cholesterol Cal: 23 mg/dL (ref 5–40)

## 2021-10-15 LAB — HEMOGLOBIN A1C
Est. average glucose Bld gHb Est-mCnc: 117 mg/dL
Hgb A1c MFr Bld: 5.7 % — ABNORMAL HIGH (ref 4.8–5.6)

## 2021-10-15 LAB — THYROID PANEL WITH TSH
Free Thyroxine Index: 1.9 (ref 1.2–4.9)
T3 Uptake Ratio: 24 % (ref 24–39)
T4, Total: 8 ug/dL (ref 4.5–12.0)
TSH: 3.68 u[IU]/mL (ref 0.450–4.500)

## 2021-10-18 ENCOUNTER — Other Ambulatory Visit: Payer: Self-pay

## 2021-10-19 ENCOUNTER — Other Ambulatory Visit: Payer: Self-pay

## 2021-10-21 ENCOUNTER — Other Ambulatory Visit: Payer: Self-pay

## 2021-11-07 ENCOUNTER — Other Ambulatory Visit: Payer: Self-pay

## 2021-11-10 ENCOUNTER — Other Ambulatory Visit: Payer: Self-pay

## 2021-11-17 ENCOUNTER — Other Ambulatory Visit: Payer: Self-pay

## 2021-12-19 ENCOUNTER — Other Ambulatory Visit: Payer: Self-pay

## 2021-12-20 ENCOUNTER — Other Ambulatory Visit: Payer: Self-pay

## 2021-12-23 ENCOUNTER — Other Ambulatory Visit: Payer: Self-pay

## 2021-12-29 ENCOUNTER — Other Ambulatory Visit: Payer: Self-pay

## 2021-12-29 ENCOUNTER — Encounter: Payer: Self-pay | Admitting: Nurse Practitioner

## 2021-12-30 ENCOUNTER — Telehealth: Payer: Self-pay | Admitting: Pharmacist

## 2021-12-30 NOTE — Telephone Encounter (Signed)
Thanks kelly!! I must have missed it :(

## 2021-12-30 NOTE — Telephone Encounter (Signed)
Glenn Conrad,   This patient messaged [place] concerning Xarelto patient assistance. Do we have any updates on his application?

## 2021-12-30 NOTE — Telephone Encounter (Signed)
Any answers for this ?

## 2022-01-02 ENCOUNTER — Other Ambulatory Visit: Payer: Self-pay

## 2022-01-05 ENCOUNTER — Ambulatory Visit: Payer: Self-pay

## 2022-01-11 ENCOUNTER — Inpatient Hospital Stay: Payer: Self-pay

## 2022-01-11 ENCOUNTER — Other Ambulatory Visit: Payer: Self-pay | Admitting: Hematology and Oncology

## 2022-01-11 ENCOUNTER — Inpatient Hospital Stay: Payer: Self-pay | Admitting: Hematology and Oncology

## 2022-01-11 DIAGNOSIS — I82461 Acute embolism and thrombosis of right calf muscular vein: Secondary | ICD-10-CM

## 2022-01-26 ENCOUNTER — Other Ambulatory Visit: Payer: Self-pay

## 2022-02-02 ENCOUNTER — Inpatient Hospital Stay (HOSPITAL_BASED_OUTPATIENT_CLINIC_OR_DEPARTMENT_OTHER): Payer: Self-pay | Admitting: Hematology and Oncology

## 2022-02-02 ENCOUNTER — Other Ambulatory Visit: Payer: Self-pay

## 2022-02-02 ENCOUNTER — Inpatient Hospital Stay: Payer: Self-pay | Attending: Hematology and Oncology

## 2022-02-02 VITALS — BP 124/89 | HR 63 | Temp 97.9°F | Resp 16 | Ht 73.0 in | Wt 265.1 lb

## 2022-02-02 DIAGNOSIS — F17210 Nicotine dependence, cigarettes, uncomplicated: Secondary | ICD-10-CM | POA: Insufficient documentation

## 2022-02-02 DIAGNOSIS — I2609 Other pulmonary embolism with acute cor pulmonale: Secondary | ICD-10-CM

## 2022-02-02 DIAGNOSIS — I82461 Acute embolism and thrombosis of right calf muscular vein: Secondary | ICD-10-CM

## 2022-02-02 DIAGNOSIS — Z86718 Personal history of other venous thrombosis and embolism: Secondary | ICD-10-CM | POA: Insufficient documentation

## 2022-02-02 DIAGNOSIS — Z86711 Personal history of pulmonary embolism: Secondary | ICD-10-CM | POA: Insufficient documentation

## 2022-02-02 LAB — CBC WITH DIFFERENTIAL (CANCER CENTER ONLY)
Abs Immature Granulocytes: 0.01 10*3/uL (ref 0.00–0.07)
Basophils Absolute: 0 10*3/uL (ref 0.0–0.1)
Basophils Relative: 0 %
Eosinophils Absolute: 0.1 10*3/uL (ref 0.0–0.5)
Eosinophils Relative: 1 %
HCT: 42.7 % (ref 39.0–52.0)
Hemoglobin: 14.3 g/dL (ref 13.0–17.0)
Immature Granulocytes: 0 %
Lymphocytes Relative: 47 %
Lymphs Abs: 2.4 10*3/uL (ref 0.7–4.0)
MCH: 30.6 pg (ref 26.0–34.0)
MCHC: 33.5 g/dL (ref 30.0–36.0)
MCV: 91.2 fL (ref 80.0–100.0)
Monocytes Absolute: 0.4 10*3/uL (ref 0.1–1.0)
Monocytes Relative: 7 %
Neutro Abs: 2.3 10*3/uL (ref 1.7–7.7)
Neutrophils Relative %: 45 %
Platelet Count: 187 10*3/uL (ref 150–400)
RBC: 4.68 MIL/uL (ref 4.22–5.81)
RDW: 12.3 % (ref 11.5–15.5)
WBC Count: 5.2 10*3/uL (ref 4.0–10.5)
nRBC: 0 % (ref 0.0–0.2)

## 2022-02-02 LAB — CMP (CANCER CENTER ONLY)
ALT: 39 U/L (ref 0–44)
AST: 21 U/L (ref 15–41)
Albumin: 4.2 g/dL (ref 3.5–5.0)
Alkaline Phosphatase: 48 U/L (ref 38–126)
Anion gap: 6 (ref 5–15)
BUN: 11 mg/dL (ref 6–20)
CO2: 30 mmol/L (ref 22–32)
Calcium: 9.5 mg/dL (ref 8.9–10.3)
Chloride: 106 mmol/L (ref 98–111)
Creatinine: 0.98 mg/dL (ref 0.61–1.24)
GFR, Estimated: >60 mL/min (ref >60)
Glucose, Bld: 115 mg/dL — ABNORMAL HIGH (ref 70–99)
Potassium: 3.7 mmol/L (ref 3.5–5.1)
Sodium: 142 mmol/L (ref 135–145)
Total Bilirubin: 0.4 mg/dL (ref 0.3–1.2)
Total Protein: 7.2 g/dL (ref 6.5–8.1)

## 2022-02-24 ENCOUNTER — Encounter: Payer: Self-pay | Admitting: Nurse Practitioner

## 2022-02-27 ENCOUNTER — Other Ambulatory Visit: Payer: Self-pay | Admitting: Nurse Practitioner

## 2022-02-27 ENCOUNTER — Other Ambulatory Visit: Payer: Self-pay

## 2022-02-27 DIAGNOSIS — E039 Hypothyroidism, unspecified: Secondary | ICD-10-CM

## 2022-02-27 MED ORDER — LEVOTHYROXINE SODIUM 100 MCG PO TABS
100.0000 ug | ORAL_TABLET | Freq: Every day | ORAL | 0 refills | Status: AC
  Filled 2022-02-27: qty 90, 90d supply, fill #0

## 2022-03-01 ENCOUNTER — Other Ambulatory Visit: Payer: Self-pay | Admitting: Nurse Practitioner

## 2022-03-01 ENCOUNTER — Other Ambulatory Visit (HOSPITAL_COMMUNITY): Payer: Self-pay

## 2022-03-01 ENCOUNTER — Other Ambulatory Visit: Payer: Self-pay

## 2022-03-01 DIAGNOSIS — I10 Essential (primary) hypertension: Secondary | ICD-10-CM

## 2022-03-01 MED ORDER — AMLODIPINE BESYLATE 5 MG PO TABS
5.0000 mg | ORAL_TABLET | Freq: Every day | ORAL | 0 refills | Status: AC
  Filled 2022-03-01: qty 90, 90d supply, fill #0

## 2022-03-02 ENCOUNTER — Other Ambulatory Visit: Payer: Self-pay

## 2022-03-31 ENCOUNTER — Other Ambulatory Visit (HOSPITAL_COMMUNITY): Payer: Self-pay

## 2022-04-06 ENCOUNTER — Telehealth: Payer: Self-pay

## 2022-04-06 ENCOUNTER — Encounter: Payer: Self-pay | Admitting: Nurse Practitioner

## 2022-04-06 ENCOUNTER — Other Ambulatory Visit: Payer: Self-pay

## 2022-04-06 NOTE — Telephone Encounter (Signed)
Patient states that his Xarelto which was dispensed via [facility] on 04/03/22 #30/30 days was 'thrown out' by his significant other.  He was requesting another refill/replacement.  I advised patient that it is too soon on his patient assistance and to fill his Xarelto today it would be $610.12 for a 30 days supply.  Gave patient the number to [facility]) [phone] to see if there is anything they can do for him if he explains his situation.  Patient cannot afford medication and may need to be changed to Warfarin if appropriate.  I am waiting to hear back from patient to see if [facility] is able to provide assistance or not.

## 2022-04-06 NOTE — Telephone Encounter (Signed)
Pt. States his significant other threw his Xarelto away and he has been without it for 2 days. Pharmacy told him a month's supply would be $600. He is using a Education officer, community program to assist with this medicine.Pt. needs assistance. Please advise pt.

## 2022-04-06 NOTE — Telephone Encounter (Signed)
Called [facility] and per program representative an emergency fill may be provided if office submits a letter on behalf of patient signed by MD.  I will type up a letter for Zelda to sign with the information regarding why there is a need for an emergency refill.  This will be a one-time emergency fill.  Rep stated that she had also talked to patient.

## 2022-04-07 ENCOUNTER — Telehealth: Payer: Self-pay

## 2022-04-07 ENCOUNTER — Other Ambulatory Visit: Payer: Self-pay

## 2022-04-07 NOTE — Telephone Encounter (Signed)
Thanks [employer]. The form has been signed. Can you also let him know there are several free clinics that are closer to him. It appears he lives in [place] and [place] not sure if this is an issue with him making his appointments to the clinic as I have not seen him since last year.

## 2022-04-07 NOTE — Telephone Encounter (Signed)
Laural Benes and [employer] Ben[employer] had provided Glenn Conrad misinformation and faxed over the correct document that they were needing for emergency fill override.  Document was signed and faxed to [facility].  [facility] confirmed receipt verbally 3:30pm.  Left a voicemail for patient with [facility] # to follow-up [phone] and reminded patient that he needed to find a PCP in [place].

## 2022-04-07 NOTE — Telephone Encounter (Signed)
Created in error

## 2022-05-04 ENCOUNTER — Other Ambulatory Visit: Payer: Self-pay | Admitting: Nurse Practitioner

## 2022-05-04 ENCOUNTER — Encounter: Payer: Self-pay | Admitting: Nurse Practitioner

## 2022-05-04 DIAGNOSIS — M1A9XX0 Chronic gout, unspecified, without tophus (tophi): Secondary | ICD-10-CM

## 2022-05-05 ENCOUNTER — Other Ambulatory Visit: Payer: Self-pay

## 2022-05-05 MED ORDER — ALLOPURINOL 100 MG PO TABS
100.0000 mg | ORAL_TABLET | Freq: Every day | ORAL | 0 refills | Status: AC
  Filled 2022-05-05: qty 90, 90d supply, fill #0

## 2022-05-05 NOTE — Telephone Encounter (Signed)
Requested Prescriptions  Pending Prescriptions Disp Refills  . allopurinol (ZYLOPRIM) 100 MG tablet 90 tablet 0    Sig: Take 1 tablet (100 mg total) by mouth daily.     Endocrinology:  Gout Agents - allopurinol Failed - 05/04/2022  9:17 PM      Failed - Uric Acid in normal range and within 360 days    Uric Acid  Date Value Ref Range Status  04/15/2020 5.5 3.8 - 8.4 mg/dL Final    Comment:               Therapeutic target for gout patients: <6.0         Failed - CBC within normal limits and completed in the last 12 months    WBC  Date Value Ref Range Status  10/26/2020 5.5 4.0 - 10.5 K/uL Final   WBC Count  Date Value Ref Range Status  02/02/2022 5.2 4.0 - 10.5 K/uL Final   RBC  Date Value Ref Range Status  02/02/2022 4.68 4.22 - 5.81 MIL/uL Final   Hemoglobin  Date Value Ref Range Status  02/02/2022 14.3 13.0 - 17.0 g/dL Final  12/15/2019 11.3 (L) 13.0 - 17.7 g/dL Final   HCT  Date Value Ref Range Status  02/02/2022 42.7 39.0 - 52.0 % Final   Hematocrit  Date Value Ref Range Status  12/15/2019 36.1 (L) 37.5 - 51.0 % Final   MCHC  Date Value Ref Range Status  02/02/2022 33.5 30.0 - 36.0 g/dL Final   [facility]  Date Value Ref Range Status  02/02/2022 30.6 26.0 - 34.0 pg Final   MCV  Date Value Ref Range Status  02/02/2022 91.2 80.0 - 100.0 fL Final  12/15/2019 87 79 - 97 fL Final   No results found for: "PLTCOUNTKUC", "LABPLAT", "POCPLA" RDW  Date Value Ref Range Status  02/02/2022 12.3 11.5 - 15.5 % Final  12/15/2019 14.5 11.6 - 15.4 % Final         Passed - Cr in normal range and within 360 days    Creatinine  Date Value Ref Range Status  02/02/2022 0.98 0.61 - 1.24 mg/dL Final         Passed - Valid encounter within last 12 months    Recent Outpatient Visits          8 months ago Primary hypertension   [place], Vernia Buff, NP   11 months ago Primary hypertension   [place], Vernia Buff, NP   1 year ago Essential hypertension   [place], Jarome Matin, RPH-CPP   1 year ago Type 2 diabetes mellitus with hyperglycemia, without long-term current use of insulin [facility])   [place], [place] W, NP   1 year ago Type 2 diabetes mellitus with hyperglycemia, without long-term current use of insulin [facility])   [place], Vernia Buff, NP

## 2022-05-08 ENCOUNTER — Other Ambulatory Visit: Payer: Self-pay

## 2022-06-05 ENCOUNTER — Other Ambulatory Visit: Payer: Self-pay

## 2022-06-07 ENCOUNTER — Other Ambulatory Visit: Payer: Self-pay

## 2022-07-03 ENCOUNTER — Other Ambulatory Visit: Payer: Self-pay

## 2022-07-11 ENCOUNTER — Other Ambulatory Visit: Payer: Self-pay

## 2022-07-14 ENCOUNTER — Other Ambulatory Visit: Payer: Self-pay

## 2022-12-06 ENCOUNTER — Other Ambulatory Visit: Payer: Self-pay
